# Patient Record
Sex: Female | Born: 1965 | Race: White | Hispanic: No | Marital: Married | Smoking: Never smoker
Health system: Southern US, Community
[De-identification: ages and names within clinical notes are randomized; demographics above are authoritative.]

## PROBLEM LIST (undated history)

## (undated) ENCOUNTER — Emergency Department (HOSPITAL_COMMUNITY): Payer: BC Managed Care – PPO | Source: Home / Self Care

## (undated) DIAGNOSIS — R87610 Atypical squamous cells of undetermined significance on cytologic smear of cervix (ASC-US): Secondary | ICD-10-CM

## (undated) DIAGNOSIS — R87810 Cervical high risk human papillomavirus (HPV) DNA test positive: Secondary | ICD-10-CM

## (undated) DIAGNOSIS — S0990XA Unspecified injury of head, initial encounter: Secondary | ICD-10-CM

## (undated) DIAGNOSIS — S069X9A Unspecified intracranial injury with loss of consciousness of unspecified duration, initial encounter: Secondary | ICD-10-CM

## (undated) HISTORY — DX: Unspecified injury of head, initial encounter: S09.90XA

## (undated) HISTORY — PX: OTHER SURGICAL HISTORY: SHX169

## (undated) HISTORY — DX: Cervical high risk human papillomavirus (HPV) DNA test positive: R87.810

## (undated) HISTORY — DX: Atypical squamous cells of undetermined significance on cytologic smear of cervix (ASC-US): R87.610

## (undated) HISTORY — PX: ABDOMINAL SURGERY: SHX537

## (undated) HISTORY — PX: AUGMENTATION MAMMAPLASTY: SUR837

---

## 1995-06-18 DIAGNOSIS — S0990XA Unspecified injury of head, initial encounter: Secondary | ICD-10-CM

## 1995-06-18 DIAGNOSIS — S069X9A Unspecified intracranial injury with loss of consciousness of unspecified duration, initial encounter: Secondary | ICD-10-CM

## 1995-06-18 DIAGNOSIS — S069XAA Unspecified intracranial injury with loss of consciousness status unknown, initial encounter: Secondary | ICD-10-CM

## 1995-06-18 HISTORY — DX: Unspecified injury of head, initial encounter: S09.90XA

## 1995-06-18 HISTORY — DX: Unspecified intracranial injury with loss of consciousness of unspecified duration, initial encounter: S06.9X9A

## 1995-06-18 HISTORY — DX: Unspecified intracranial injury with loss of consciousness status unknown, initial encounter: S06.9XAA

## 1998-02-22 ENCOUNTER — Other Ambulatory Visit: Admission: RE | Admit: 1998-02-22 | Discharge: 1998-02-22 | Payer: Self-pay | Admitting: Obstetrics and Gynecology

## 1998-06-27 ENCOUNTER — Other Ambulatory Visit: Admission: RE | Admit: 1998-06-27 | Discharge: 1998-06-27 | Payer: Self-pay | Admitting: Gynecology

## 1998-12-12 ENCOUNTER — Inpatient Hospital Stay (HOSPITAL_COMMUNITY): Admission: AD | Admit: 1998-12-12 | Discharge: 1998-12-12 | Payer: Self-pay | Admitting: Gynecology

## 1999-01-23 ENCOUNTER — Inpatient Hospital Stay (HOSPITAL_COMMUNITY): Admission: AD | Admit: 1999-01-23 | Discharge: 1999-01-27 | Payer: Self-pay | Admitting: Obstetrics and Gynecology

## 1999-01-23 ENCOUNTER — Encounter (INDEPENDENT_AMBULATORY_CARE_PROVIDER_SITE_OTHER): Payer: Self-pay | Admitting: Specialist

## 1999-01-30 ENCOUNTER — Encounter (HOSPITAL_COMMUNITY): Admission: RE | Admit: 1999-01-30 | Discharge: 1999-04-30 | Payer: Self-pay | Admitting: Gynecology

## 1999-02-12 ENCOUNTER — Other Ambulatory Visit: Admission: RE | Admit: 1999-02-12 | Discharge: 1999-02-12 | Payer: Self-pay | Admitting: Gynecology

## 2003-01-31 ENCOUNTER — Other Ambulatory Visit: Admission: RE | Admit: 2003-01-31 | Discharge: 2003-01-31 | Payer: Self-pay | Admitting: Gynecology

## 2004-02-08 ENCOUNTER — Other Ambulatory Visit: Admission: RE | Admit: 2004-02-08 | Discharge: 2004-02-08 | Payer: Self-pay | Admitting: Gynecology

## 2005-05-20 ENCOUNTER — Other Ambulatory Visit: Admission: RE | Admit: 2005-05-20 | Discharge: 2005-05-20 | Payer: Self-pay | Admitting: Gynecology

## 2006-05-22 ENCOUNTER — Other Ambulatory Visit: Admission: RE | Admit: 2006-05-22 | Discharge: 2006-05-22 | Payer: Self-pay | Admitting: Gynecology

## 2007-07-28 ENCOUNTER — Other Ambulatory Visit: Admission: RE | Admit: 2007-07-28 | Discharge: 2007-07-28 | Payer: Self-pay | Admitting: Gynecology

## 2007-11-10 ENCOUNTER — Emergency Department (HOSPITAL_COMMUNITY): Admission: EM | Admit: 2007-11-10 | Discharge: 2007-11-10 | Payer: Self-pay | Admitting: Emergency Medicine

## 2008-07-28 ENCOUNTER — Other Ambulatory Visit: Admission: RE | Admit: 2008-07-28 | Discharge: 2008-07-28 | Payer: Self-pay | Admitting: Gynecology

## 2008-07-28 ENCOUNTER — Ambulatory Visit: Payer: Self-pay | Admitting: Gynecology

## 2008-07-28 ENCOUNTER — Encounter: Payer: Self-pay | Admitting: Gynecology

## 2008-12-14 ENCOUNTER — Ambulatory Visit: Payer: Self-pay | Admitting: Gynecology

## 2009-08-10 ENCOUNTER — Ambulatory Visit: Payer: Self-pay | Admitting: Gynecology

## 2009-08-10 ENCOUNTER — Other Ambulatory Visit: Admission: RE | Admit: 2009-08-10 | Discharge: 2009-08-10 | Payer: Self-pay | Admitting: Gynecology

## 2009-12-22 IMAGING — CR DG WRIST COMPLETE 3+V*L*
3 series · 3 of 3 positions shown · non-contrast
Comparison: None.

CLINICAL DATA: Left wrist fracture.

LEFT WRIST - COMPLETE 3+ VIEW

[x wrist pa left]
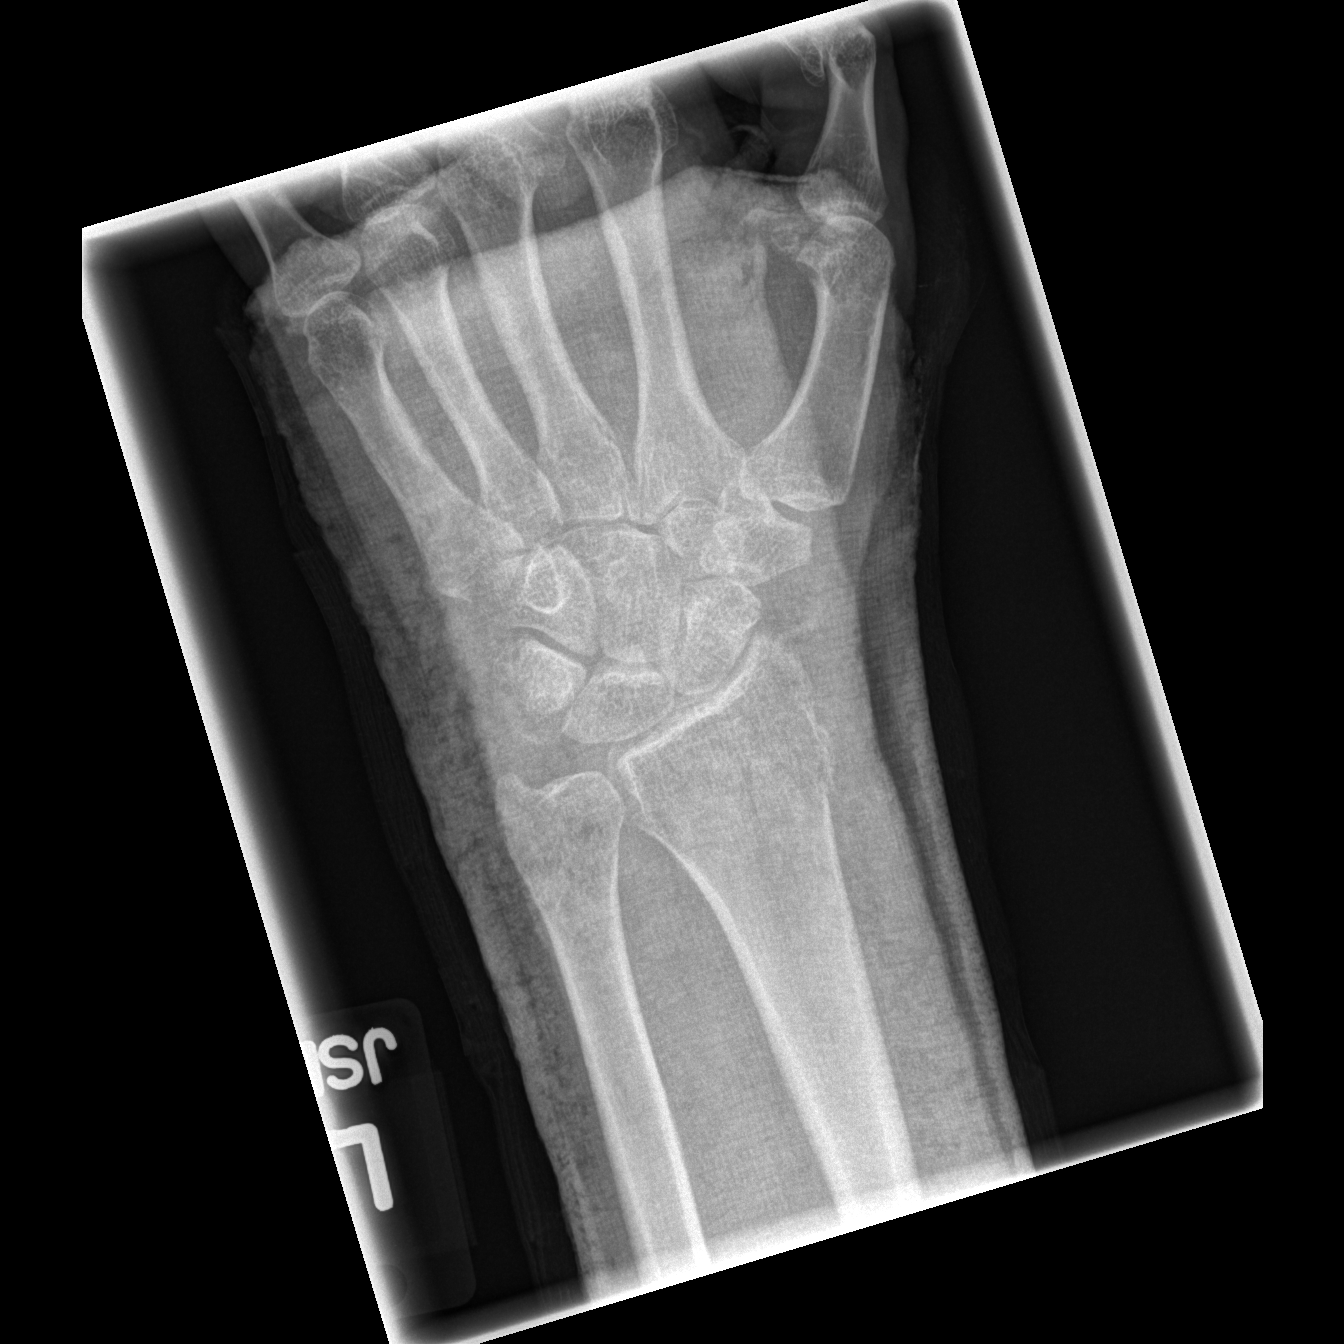

[x wrist obl left]
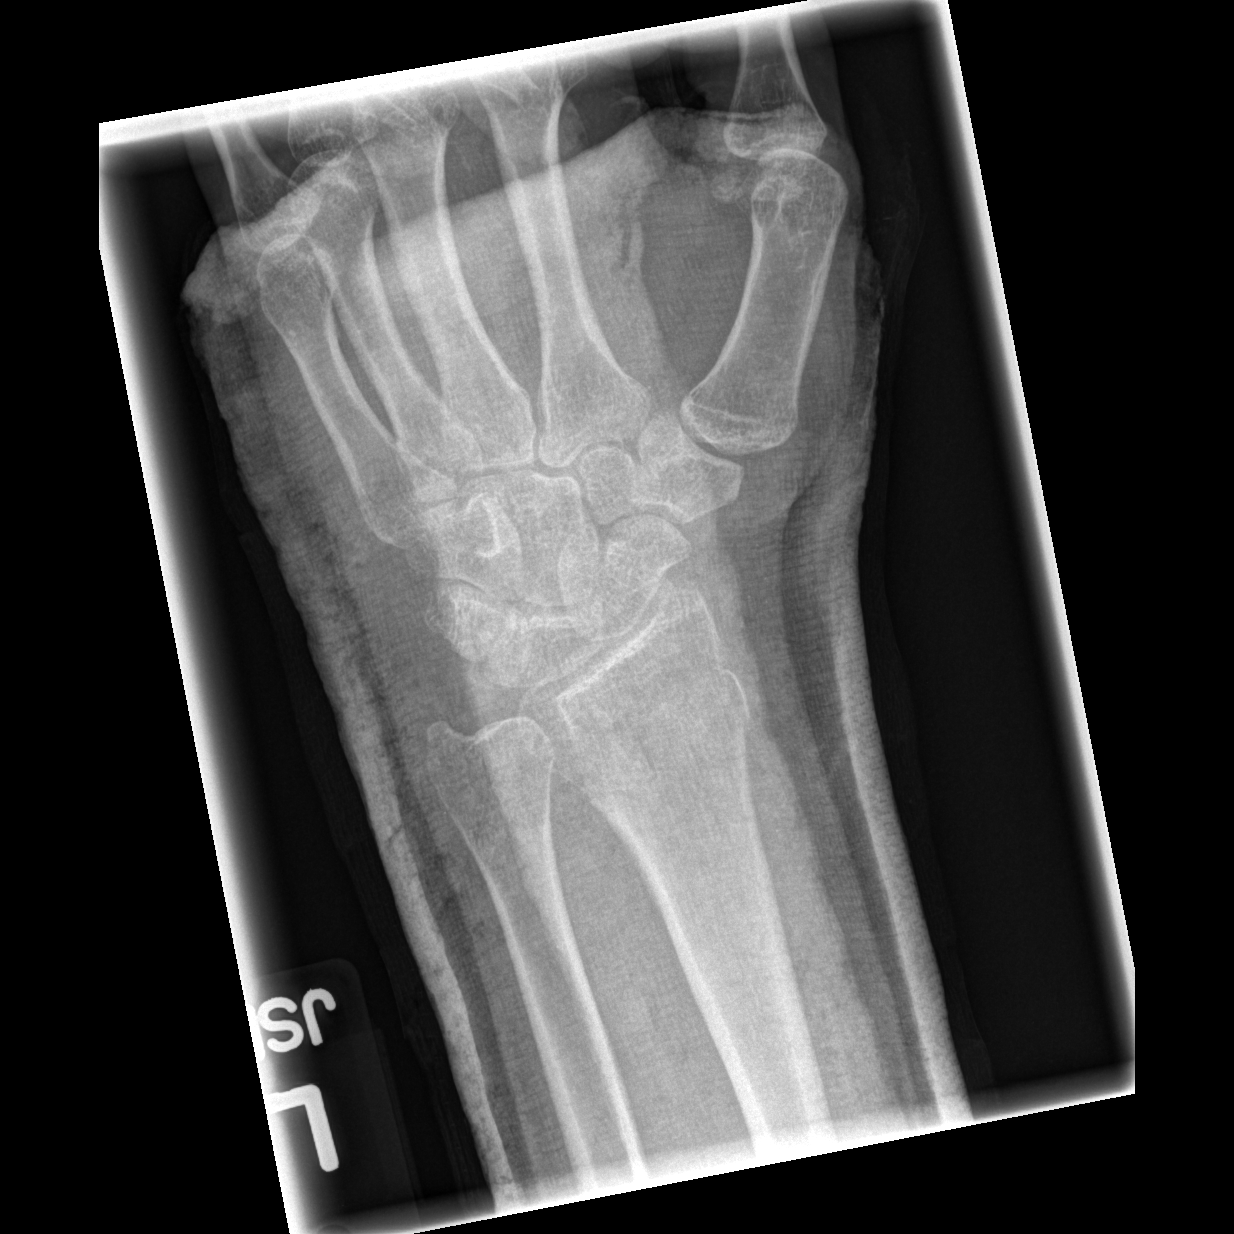

[x wrist lat left]
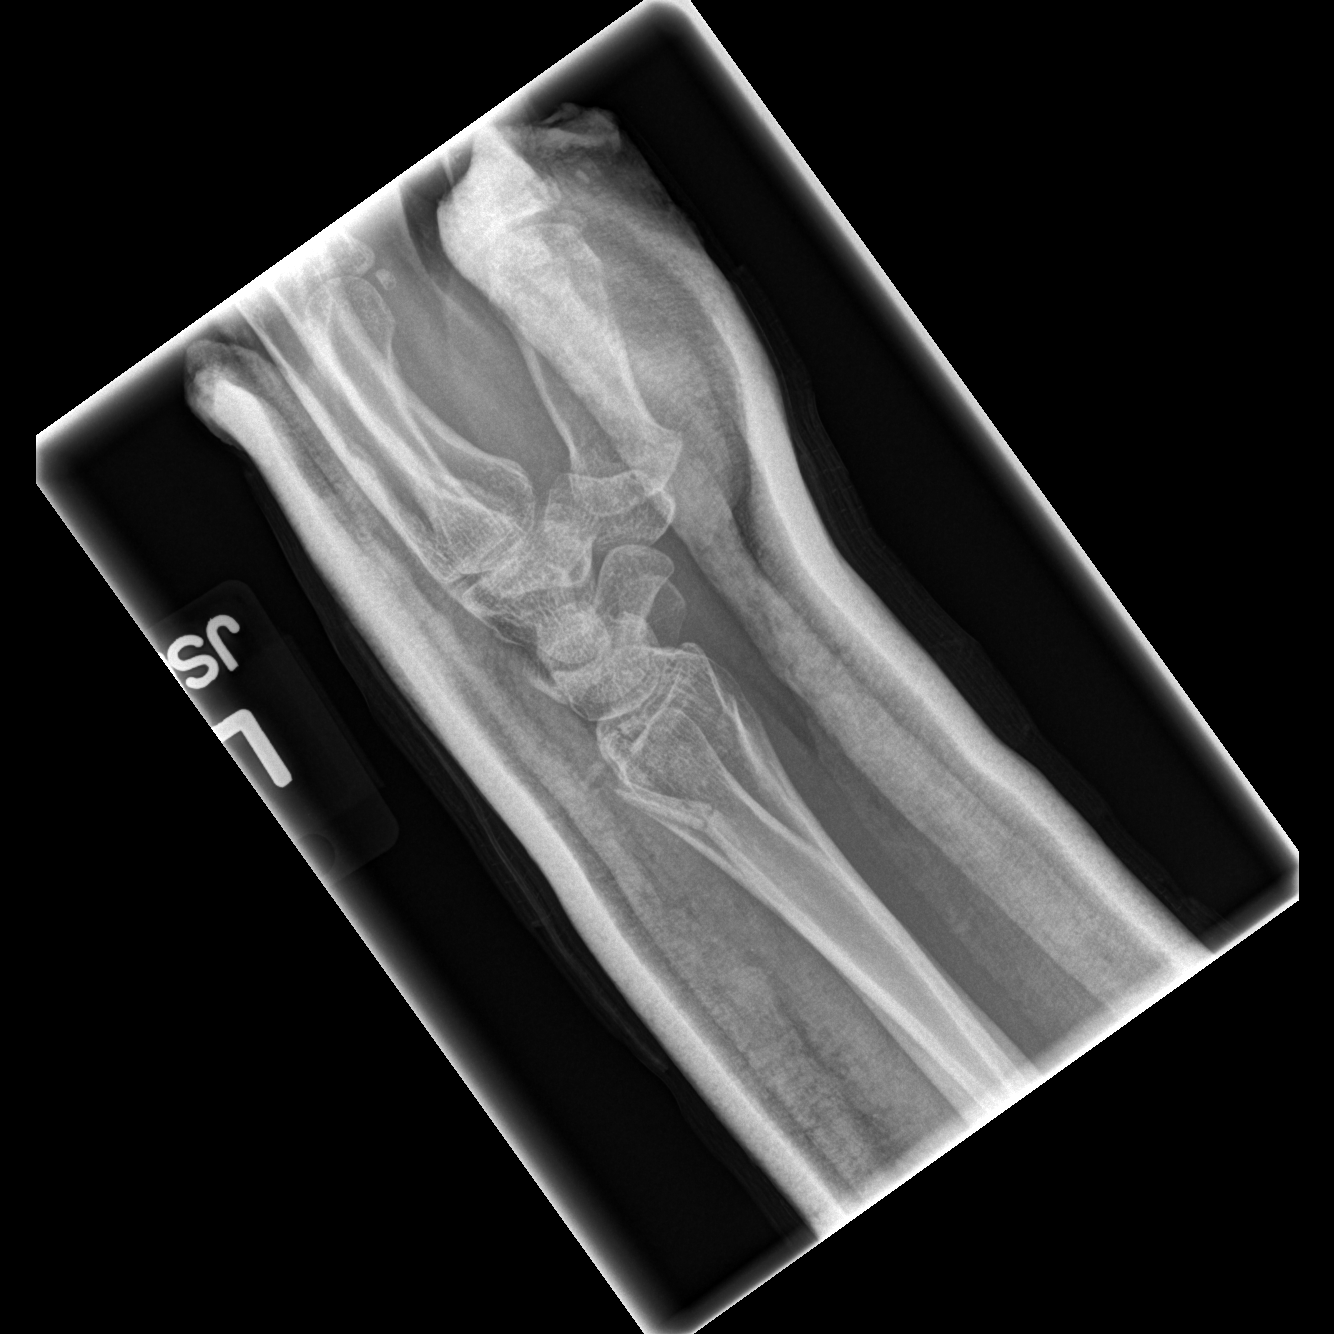

[3 of 3 positions shown; findings below may reference images not displayed]

FINDINGS: Three views of the left wrist were obtained with the
wrist in a plaster splint.  These demonstrate a comminuted fracture
of the distal radial metaphysis, extending into the radiocarpal
joint.  Mild dorsal displacement and angulation of a dorsal
fragment is noted.  No ulna fracture is visualized.
IMPRESSION: Comminuted distal radius fracture, as described above.
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
## 2010-11-14 ENCOUNTER — Other Ambulatory Visit: Payer: Self-pay | Admitting: Gynecology

## 2010-11-14 ENCOUNTER — Other Ambulatory Visit (HOSPITAL_COMMUNITY)
Admission: RE | Admit: 2010-11-14 | Discharge: 2010-11-14 | Disposition: A | Discharge status: Home / Self Care | Payer: BC Managed Care – PPO | Source: Ambulatory Visit | Attending: Gynecology | Admitting: Gynecology

## 2010-11-14 ENCOUNTER — Encounter (INDEPENDENT_AMBULATORY_CARE_PROVIDER_SITE_OTHER): Payer: BC Managed Care – PPO | Admitting: Gynecology

## 2010-11-14 DIAGNOSIS — Z13220 Encounter for screening for lipoid disorders: Secondary | ICD-10-CM

## 2010-11-14 DIAGNOSIS — Z124 Encounter for screening for malignant neoplasm of cervix: Secondary | ICD-10-CM | POA: Insufficient documentation

## 2010-11-14 DIAGNOSIS — Z01419 Encounter for gynecological examination (general) (routine) without abnormal findings: Secondary | ICD-10-CM

## 2010-11-14 DIAGNOSIS — Z833 Family history of diabetes mellitus: Secondary | ICD-10-CM

## 2012-03-11 ENCOUNTER — Encounter: Payer: Self-pay | Admitting: Gynecology

## 2012-03-11 ENCOUNTER — Ambulatory Visit (INDEPENDENT_AMBULATORY_CARE_PROVIDER_SITE_OTHER): Payer: BC Managed Care – PPO | Admitting: Gynecology

## 2012-03-11 VITALS — BP 104/62 | Ht 65.0 in | Wt 124.0 lb

## 2012-03-11 DIAGNOSIS — Z01419 Encounter for gynecological examination (general) (routine) without abnormal findings: Secondary | ICD-10-CM

## 2012-03-11 DIAGNOSIS — N951 Menopausal and female climacteric states: Secondary | ICD-10-CM

## 2012-03-11 LAB — CBC WITH DIFFERENTIAL/PLATELET
Basophils Absolute: 0 10*3/uL (ref 0.0–0.1)
Eosinophils Relative: 1 % (ref 0–5)
HCT: 37.1 % (ref 36.0–46.0)
Lymphocytes Relative: 23 % (ref 12–46)
MCHC: 33.7 g/dL (ref 30.0–36.0)
MCV: 90.5 fL (ref 78.0–100.0)
Monocytes Absolute: 0.4 10*3/uL (ref 0.1–1.0)
RDW: 13.2 % (ref 11.5–15.5)
WBC: 5.7 10*3/uL (ref 4.0–10.5)

## 2012-03-11 NOTE — Progress Notes (Signed)
Martha Caldwell 1966-02-20 [id number]        46 y.o.  G2P2 for annual exam.  Doing well.  Past medical history,surgical history, medications, allergies, family history and social history were all reviewed and documented in the EPIC chart. ROS:  Was performed and pertinent positives and negatives are included in the history.  Exam: Sherrilyn Rist assistant Filed Vitals:   03/11/12 1026  BP: 104/62  Height: 5\' 5"  (1.651 m)  Weight: 124 lb (56.246 kg)   General appearance  Normal Skin grossly normal Head/Neck normal with no cervical or supraclavicular adenopathy thyroid normal Lungs  clear Cardiac RR, without RMG Abdominal  soft, nontender, without masses, organomegaly or hernia. Well-healed abdominoplasty scars. Breasts  examined lying and sitting without masses, retractions, discharge or axillary adenopathy.  Bilateral implants noted. Pelvic  Ext/BUS/vagina  normal   Cervix  normal   Uterus  anteverted, normal size, shape and contour, midline and mobile nontender   Adnexa  Without masses or tenderness    Anus and perineum  normal   Rectovaginal  normal sphincter tone without palpated masses or tenderness.    Assessment/Plan:  46 y.o. G2P2 female for annual exam.   1. Contraception. Patient continues with withdrawal method. We have discussed this multiple times and she understands and accepts the failure risks associated with this. We've discussed options including IUD and she has declined these. 2. Mammography. Patient had her mammogram today. SBE monthly reviewed. Continue with annual mammography. 3. Pap smear. No Pap smear done today. Last Pap smear 2012. Multiple normal reports in her chart with no history of abnormal Pap smears. Review current screening guidelines and plan repeat every 3-5 years. 4. Some occasional hot flushes and mild menstrual irregularity with menses coming a little earlier or later but monthly.  We'll check TSH/FSH. Suspect age related and we'll continue to monitor  if labs normal. 5. Health maintenance. CBC/urinalysis with above labs. Glucose/lipid profile normal last year and we'll not repeat this year. Follow up one year, sooner as needed.    Dara Lords MD, 10:49 AM 03/11/2012

## 2012-03-11 NOTE — Patient Instructions (Signed)
Follow up one year for annual exam 

## 2012-03-12 LAB — URINALYSIS W MICROSCOPIC + REFLEX CULTURE
Bilirubin Urine: NEGATIVE
Casts: NONE SEEN
Crystals: NONE SEEN
Glucose, UA: NEGATIVE mg/dL
Ketones, ur: NEGATIVE mg/dL
Specific Gravity, Urine: 1.012 (ref 1.005–1.030)
Urobilinogen, UA: 1 mg/dL (ref 0.0–1.0)

## 2013-03-17 ENCOUNTER — Encounter: Payer: BC Managed Care – PPO | Admitting: Gynecology

## 2013-03-18 ENCOUNTER — Encounter: Payer: Self-pay | Admitting: Gynecology

## 2013-04-14 ENCOUNTER — Encounter: Payer: Self-pay | Admitting: Gynecology

## 2013-04-14 ENCOUNTER — Ambulatory Visit (INDEPENDENT_AMBULATORY_CARE_PROVIDER_SITE_OTHER): Payer: BC Managed Care – PPO | Admitting: Gynecology

## 2013-04-14 VITALS — BP 120/74 | Ht 65.0 in | Wt 123.0 lb

## 2013-04-14 DIAGNOSIS — N898 Other specified noninflammatory disorders of vagina: Secondary | ICD-10-CM

## 2013-04-14 DIAGNOSIS — N926 Irregular menstruation, unspecified: Secondary | ICD-10-CM

## 2013-04-14 DIAGNOSIS — Z01419 Encounter for gynecological examination (general) (routine) without abnormal findings: Secondary | ICD-10-CM

## 2013-04-14 LAB — CBC WITH DIFFERENTIAL/PLATELET
Basophils Absolute: 0 10*3/uL (ref 0.0–0.1)
Eosinophils Absolute: 0.2 10*3/uL (ref 0.0–0.7)
Eosinophils Relative: 3 % (ref 0–5)
MCH: 29.8 pg (ref 26.0–34.0)
MCHC: 33.8 g/dL (ref 30.0–36.0)
MCV: 88.2 fL (ref 78.0–100.0)
Monocytes Absolute: 0.5 10*3/uL (ref 0.1–1.0)
Platelets: 259 10*3/uL (ref 150–400)
RDW: 13.5 % (ref 11.5–15.5)

## 2013-04-14 LAB — COMPREHENSIVE METABOLIC PANEL
ALT: 10 U/L (ref 0–35)
AST: 13 U/L (ref 0–37)
BUN: 13 mg/dL (ref 6–23)
Creat: 0.66 mg/dL (ref 0.50–1.10)
Total Bilirubin: 0.6 mg/dL (ref 0.3–1.2)

## 2013-04-14 LAB — LIPID PANEL
Cholesterol: 116 mg/dL (ref 0–200)
HDL: 37 mg/dL — ABNORMAL LOW (ref >39)
Total CHOL/HDL Ratio: 3.1 Ratio
VLDL: 10 mg/dL (ref 0–40)

## 2013-04-14 LAB — FOLLICLE STIMULATING HORMONE: FSH: 14.4 m[IU]/mL

## 2013-04-14 LAB — WET PREP FOR TRICH, YEAST, CLUE: Trich, Wet Prep: NONE SEEN

## 2013-04-14 LAB — TSH: TSH: 3.356 u[IU]/mL (ref 0.350–4.500)

## 2013-04-14 MED ORDER — METRONIDAZOLE 500 MG PO TABS: 500.0000 mg | ORAL_TABLET | Freq: Two times a day (BID) | ORAL | Status: DC

## 2013-04-14 NOTE — Progress Notes (Signed)
Martha Caldwell September 10, 1965 [id number]        47 y.o.  G2P2 for annual exam.  Several issues noted below.  Past medical history,surgical history, medications, allergies, family history and social history were all reviewed and documented in the EPIC chart.  ROS:  Performed and pertinent positives and negatives are included in the history, assessment and plan .  Exam: [name] assistant Filed Vitals:   04/14/13 0835  BP: 120/74  Height: 5\' 5"  (1.651 m)  Weight: 123 lb (55.792 kg)   General appearance  Normal Skin grossly normal Head/Neck normal with no cervical or supraclavicular adenopathy thyroid normal Lungs  clear Cardiac RR, without RMG Abdominal  soft, nontender, without masses, organomegaly or hernia Breasts  examined lying and sitting without masses, retractions, discharge or axillary adenopathy. Bilateral implants noted Pelvic  Ext/BUS/vagina  very heavy yellow discharge  Cervix  normal GC/Chlamydia  Uterus  anteverted, normal size, shape and contour, midline and mobile nontender   Adnexa  Without masses or tenderness    Anus and perineum  normal   Rectovaginal  normal sphincter tone without palpated masses or tenderness.    Assessment/Plan:  47 y.o. G2P2 female for annual exam.   1. Heavy vaginal discharge. Clinically suspicious for Trichomonas although wet prep was negative. Treat with Flagyl 500 mg twice a day x7 days. GC chlamydia screen of cervix done. 2. Irregular menses. Patient states that this past year her periods have become more irregular where she will start earlier or later never skips a month. Does have intermenstrual spotting. FSH 19 last year. Recheck FSH/TSH. Baseline sonohysterogram rule out intracavitary abnormalities. Various scenarios and options reviewed pending laboratory/ultrasound results. 3. Contraception. Patient continues to use withdrawal method. We have discussed multiple times the failure risk and she understands this and declines alternative  contraception. 4. Mammography 03/2013. Continued annual mammography. SBE monthly reviewed. 5. Pap smear 2012. No Pap smear done today No history of abnormal Pap smears. Plan repeat Pap smear next year at 3 year interval. 6. Health maintenance. Baseline CBC comprehensive metabolic panel lipid profile urinalysis TSH FSH ordered. Followup for sonohysterogram.  Note: This document was prepared with digital dictation and possible smart phrase technology. Any transcriptional errors that result from this process are unintentional.   Dara Lords MD, 9:04 AM 04/14/2013

## 2013-04-14 NOTE — Patient Instructions (Signed)
Followup for ultrasound as scheduled. Take Flagyl medication twice daily for 7 days. Avoid alcohol while taking.

## 2013-04-15 LAB — URINALYSIS W MICROSCOPIC + REFLEX CULTURE
Casts: NONE SEEN
Glucose, UA: NEGATIVE mg/dL
Hgb urine dipstick: NEGATIVE
Ketones, ur: NEGATIVE mg/dL
Protein, ur: NEGATIVE mg/dL
pH: 7.5 (ref 5.0–8.0)

## 2013-04-16 LAB — GC/CHLAMYDIA PROBE AMP: CT Probe RNA: UNDETERMINED

## 2013-04-17 LAB — URINE CULTURE: Colony Count: >=100000

## 2013-04-19 ENCOUNTER — Other Ambulatory Visit: Payer: Self-pay | Admitting: Gynecology

## 2013-04-19 MED ORDER — AMPICILLIN 500 MG PO CAPS: 500.0000 mg | ORAL_CAPSULE | Freq: Four times a day (QID) | ORAL | Status: DC

## 2013-04-30 ENCOUNTER — Other Ambulatory Visit: Payer: Self-pay | Admitting: Gynecology

## 2013-04-30 ENCOUNTER — Ambulatory Visit (INDEPENDENT_AMBULATORY_CARE_PROVIDER_SITE_OTHER): Payer: BC Managed Care – PPO

## 2013-04-30 ENCOUNTER — Encounter: Payer: Self-pay | Admitting: Gynecology

## 2013-04-30 ENCOUNTER — Ambulatory Visit (INDEPENDENT_AMBULATORY_CARE_PROVIDER_SITE_OTHER): Payer: BC Managed Care – PPO | Admitting: Gynecology

## 2013-04-30 DIAGNOSIS — N938 Other specified abnormal uterine and vaginal bleeding: Secondary | ICD-10-CM

## 2013-04-30 DIAGNOSIS — Z113 Encounter for screening for infections with a predominantly sexual mode of transmission: Secondary | ICD-10-CM

## 2013-04-30 DIAGNOSIS — N949 Unspecified condition associated with female genital organs and menstrual cycle: Secondary | ICD-10-CM

## 2013-04-30 DIAGNOSIS — N926 Irregular menstruation, unspecified: Secondary | ICD-10-CM

## 2013-04-30 DIAGNOSIS — IMO0002 Reserved for concepts with insufficient information to code with codable children: Secondary | ICD-10-CM

## 2013-04-30 DIAGNOSIS — N83209 Unspecified ovarian cyst, unspecified side: Secondary | ICD-10-CM

## 2013-04-30 NOTE — Progress Notes (Signed)
Patient presents for sonohysterogram due to history of irregular bleeding. Menses are longer very as far as when they occur during the month and  was having some intermenstrual spotting.  Ultrasound shows uterus normal size and echotexture. Endometrial echo 7.1 mm. Left ovary is normal. Right ovary with 52 x 27 x 50 mm echo-free thin-walled cyst. Septum versus 2 separate cysts noted. Slight vascular activity. Cul-de-sac negative.  Sonohysterogram was performed, sterile technique, easy catheter introduction, good distention with no abnormalities. Endometrial sample taken. Patient tolerated well.  Assessment and plan: 1. Irregular menses. Sonohysterogram is negative. Patient will followup for biopsy results. Her FSH and TSH were normal. Patient leaning towards Mirena IUD to help with the bleeding as well as contraception. Will followup if she ultimately decides for this. Otherwise will keep menstrual calendar over the next 2 months. 2. Ovarian cyst. Reviewed possibilities with the patient to include functional, benign tumor, malignancy. Patient is otherwise asymptomatic. Question whether this is shooting to her irregular bleeding also. Recommend repeat ultrasound in 2 months. Scenarios reviewed to include possible laparoscopy with removal. Patient's comfortable with the two-month observation and will schedule an appointment for this. 3. Followup GC Chlamydia culture. Patient had a culture done previously and workup of her heavy vaginal discharge. The culture returned indeterminate for both. I repeated this today.

## 2013-04-30 NOTE — Patient Instructions (Signed)
Followup in 2 months for repeat ultrasound to followup on the ovarian cyst. Followup sooner if you want to go ahead with the Mirena IUD. Office will call you with biopsy results within the next several days.

## 2013-05-01 LAB — GC/CHLAMYDIA PROBE AMP
CT Probe RNA: NEGATIVE
GC Probe RNA: NEGATIVE

## 2013-05-07 ENCOUNTER — Telehealth: Payer: Self-pay | Admitting: Gynecology

## 2013-05-07 NOTE — Telephone Encounter (Signed)
05/07/13-Pt insurance covers the Mirena IUD & insertion at 100%. Unable to LM for pt as VM not set up. She has appt with TF on 05/11/13/[facility]

## 2013-05-11 ENCOUNTER — Encounter: Payer: Self-pay | Admitting: Gynecology

## 2013-05-11 ENCOUNTER — Ambulatory Visit (INDEPENDENT_AMBULATORY_CARE_PROVIDER_SITE_OTHER): Payer: BC Managed Care – PPO | Admitting: Gynecology

## 2013-05-11 DIAGNOSIS — Z30431 Encounter for routine checking of intrauterine contraceptive device: Secondary | ICD-10-CM

## 2013-05-11 MED ORDER — LEVONORGESTREL 20 MCG/24HR IU IUD: INTRAUTERINE_SYSTEM | Freq: Once | INTRAUTERINE | Status: DC

## 2013-05-11 NOTE — Progress Notes (Signed)
Patient presents to have IUD placed but unfortunately has not started her period yet. She was asked to return when she is on a regular menses.

## 2013-05-11 NOTE — Patient Instructions (Addendum)
Follow for ID placement once you're on a regular menses.

## 2013-07-02 ENCOUNTER — Other Ambulatory Visit: Payer: BC Managed Care – PPO

## 2013-07-02 ENCOUNTER — Ambulatory Visit: Payer: BC Managed Care – PPO | Admitting: Gynecology

## 2013-08-06 ENCOUNTER — Encounter: Payer: Self-pay | Admitting: Gynecology

## 2013-08-06 ENCOUNTER — Ambulatory Visit (INDEPENDENT_AMBULATORY_CARE_PROVIDER_SITE_OTHER): Payer: BC Managed Care – PPO | Admitting: Gynecology

## 2013-08-06 DIAGNOSIS — N83209 Unspecified ovarian cyst, unspecified side: Secondary | ICD-10-CM

## 2013-08-06 DIAGNOSIS — Z30430 Encounter for insertion of intrauterine contraceptive device: Secondary | ICD-10-CM

## 2013-08-06 HISTORY — PX: INTRAUTERINE DEVICE INSERTION: SHX323

## 2013-08-06 NOTE — Progress Notes (Signed)
Patient presents for Mirena IUD placement. She has read through the booklet, has no contraindications and signed the consent form. She currently is on a normal menses.  I reviewed the insertional process with her as well as the risks to include infection, either immediate or long-term, uterine perforation or migration requiring surgery to remove, other complications such as pain, hormonal side effects and possibility of failure with subsequent pregnancy.   Exam with Sherrilyn RistKari assistant Pelvic: External BUS vagina normal. Cervix normal with menses flow. Uterus anteverted normal size shape contour midline mobile nontender. Adnexa without masses or tenderness.  Procedure: The cervix was cleansed with Betadine, anterior lip grasped with a single-tooth tenaculum, the uterus was sounded and a Mirena IUD was placed according to manufacturer's recommendations without difficulty. The strings were trimmed. The patient tolerated well and will follow up in one month for a postinsertional check.  Patient also is to have a repeat ultrasound to followup on an ovarian cyst noted at her 04/2013 ultrasound. She will schedule this appointment along with her postinsertional check appointment.  Lot number:  [id number]    Note: This document was prepared with digital dictation and possible smart phrase technology. Any transcriptional errors that result from this process are unintentional.  Dara LordsFONTAINE,TIMOTHY P MD, 10:08 AM 08/06/2013

## 2013-08-06 NOTE — Patient Instructions (Signed)
Intrauterine Device Insertion   Most often, an intrauterine device (IUD) is inserted into the uterus to prevent pregnancy. There are 2 types of IUDs available:  · Copper IUD This type of IUD creates an environment that is not favorable to sperm survival. The mechanism of action of the copper IUD is not known for certain. It can stay in place for 10 years.  · Hormone IUD This type of IUD contains the hormone progestin (synthetic progesterone). The progestin thickens the cervical mucus and prevents sperm from entering the uterus, and it also thins the uterine lining. There is no evidence that the hormone IUD prevents implantation. One hormone IUD can stay in place for up to 5 years, and a different hormone IUD can stay in place for up to 3 years.  An IUD is the most cost-effective birth control if left in place for the full duration. It may be removed at any time.  LET YOUR HEALTH CARE PROVIDER KNOW ABOUT:  · Any allergies you have.  · All medicines you are taking, including vitamins, herbs, eye drops, creams, and over-the-counter medicines.  · Previous problems you or members of your family have had with the use of anesthetics.  · Any blood disorders you have.  · Previous surgeries you have had.  · Possibility of pregnancy.  · Medical conditions you have.  RISKS AND COMPLICATIONS   Generally, intrauterine device insertion is a safe procedure. However, as with any procedure, complications can occur. Possible complications include:  · Accidental puncture (perforation) of the uterus.  · Accidental placement of the IUD either in the muscle layer of the uterus (myometrium) or outside the uterus. If this happens, the IUD can be found essentially floating around the bowels and must be taken out surgically.  · The IUD may fall out of the uterus (expulsion). This is more common in women who have recently had a child.    · Pregnancy in the fallopian tube (ectopic).  · Pelvic inflammatory disease (PID), which is infection of  the uterus and fallopian tubes. The risk of PID is slightly increased in the first 20 days after the IUD is placed, but the overall risk is still very low.  BEFORE THE PROCEDURE  · Schedule the IUD insertion for when you will have your menstrual period or right after, to make sure you are not pregnant. Placement of the IUD is better tolerated shortly after a menstrual cycle.  · You may need to take tests or be examined to make sure you are not pregnant.  · You may be required to take a pregnancy test.  · You may be required to get checked for sexually transmitted infections (STIs) prior to placement. Placing an IUD in someone who has an infection can make the infection worse.  · You may be given a pain reliever to take 1 or 2 hours before the procedure.  · An exam will be performed to determine the size and position of your uterus.  · Ask your health care provider about changing or stopping your regular medicines.  PROCEDURE   · A tool (speculum) is placed in the vagina. This allows your health care provider to see the lower part of the uterus (cervix).  · The cervix is prepped with a medicine that lowers the risk of infection.  · You may be given a medicine to numb each side of the cervix (intracervical or paracervical block). This is used to block and control any discomfort with insertion.  · A tool (  uterine sound) is inserted into the uterus to determine the length of the uterine cavity and the direction the uterus may be tilted.  · A slim instrument (IUD inserter) is inserted through the cervical canal and into your uterus.  · The IUD is placed in the uterine cavity and the insertion device is removed.  · The nylon string that is attached to the IUD and used for eventual IUD removal is trimmed. It is trimmed so that it lays high in the vagina, just outside the cervix.  AFTER THE PROCEDURE  · You may have bleeding after the procedure. This is normal. It varies from light spotting for a few days to menstrual-like  bleeding.   · You may have mild cramping.  Document Released: 01/30/2011 Document Revised: 03/24/2013 Document Reviewed: 11/22/2012  Exit[employer]® Patient Information ©2014 [employer].

## 2013-09-03 ENCOUNTER — Other Ambulatory Visit: Payer: Self-pay | Admitting: Gynecology

## 2013-09-03 ENCOUNTER — Encounter: Payer: Self-pay | Admitting: Gynecology

## 2013-09-03 ENCOUNTER — Ambulatory Visit (INDEPENDENT_AMBULATORY_CARE_PROVIDER_SITE_OTHER): Payer: BC Managed Care – PPO | Admitting: Gynecology

## 2013-09-03 ENCOUNTER — Ambulatory Visit (INDEPENDENT_AMBULATORY_CARE_PROVIDER_SITE_OTHER): Payer: BC Managed Care – PPO

## 2013-09-03 DIAGNOSIS — N83209 Unspecified ovarian cyst, unspecified side: Secondary | ICD-10-CM

## 2013-09-03 DIAGNOSIS — Z30431 Encounter for routine checking of intrauterine contraceptive device: Secondary | ICD-10-CM

## 2013-09-03 NOTE — Patient Instructions (Signed)
Followup in November 2015 for annual exam. Followup sooner if any issues.

## 2013-09-03 NOTE — Progress Notes (Signed)
Martha BrassMelissa D Caldwell 25-Feb-1966 [id number]        48 y.o.  G2P2 presents for followup ultrasound with history of 41 mm mean right ovarian cyst on November 2014 ultrasound. Also for followup IUD insertion will check. Had some bleeding for several weeks after insertion but has done well without bleeding now. Not having any pain or other issues.  Past medical history,surgical history, problem list, medications, allergies, family history and social history were all reviewed and documented in the EPIC chart.  Exam: [name] assistant General appearance  Normal Abdomen soft nontender without masses guarding rebound organomegaly. Pelvic external BUS vagina normal. Cervix normal with IUD string visualized and normal length. Uterus grossly normal sized mobile nontender. Adnexa without masses or tenderness.  Ultrasound shows uterus grossly normal generous in size. Endometrial echo 9.3 mm. IUD visualized within the endometrial cavity. Right ovary normal. Left ovary with echo-free thin-walled avascular cyst 33 mm mean. Cul-de-sac negative.  Assessment/Plan:  48 y.o. G2P2  1. History of right ovarian cyst now resolved. Now with left ovarian cyst. Small avascular. Suspect physiologic. Options of followup to include repeat ultrasound versus expectant management discussed. I think given the situation expectant management is reasonable and she agrees. She'll follow up if she has any pain or other symptoms. 2. IUD check. Doing well. IUD string appropriate length. Ultrasound demonstrates within the cavity. Followup in November 2015 and she is due for her annual exam. Sooner if any issues.   Note: This document was prepared with digital dictation and possible smart phrase technology. Any transcriptional errors that result from this process are unintentional.   Dara LordsFONTAINE,Finn Amos P MD, 10:32 AM 09/03/2013

## 2014-04-17 DIAGNOSIS — R87610 Atypical squamous cells of undetermined significance on cytologic smear of cervix (ASC-US): Secondary | ICD-10-CM

## 2014-04-17 HISTORY — DX: Atypical squamous cells of undetermined significance on cytologic smear of cervix (ASC-US): R87.610

## 2014-04-18 ENCOUNTER — Ambulatory Visit (INDEPENDENT_AMBULATORY_CARE_PROVIDER_SITE_OTHER): Payer: BC Managed Care – PPO | Admitting: Gynecology

## 2014-04-18 ENCOUNTER — Other Ambulatory Visit (HOSPITAL_COMMUNITY)
Admission: RE | Admit: 2014-04-18 | Discharge: 2014-04-18 | Disposition: A | Discharge status: Home / Self Care | Payer: BC Managed Care – PPO | Source: Ambulatory Visit | Attending: Gynecology | Admitting: Gynecology

## 2014-04-18 ENCOUNTER — Encounter: Payer: Self-pay | Admitting: Gynecology

## 2014-04-18 VITALS — BP 120/70 | Ht 66.0 in | Wt 129.0 lb

## 2014-04-18 DIAGNOSIS — Z1151 Encounter for screening for human papillomavirus (HPV): Secondary | ICD-10-CM | POA: Insufficient documentation

## 2014-04-18 DIAGNOSIS — Z30431 Encounter for routine checking of intrauterine contraceptive device: Secondary | ICD-10-CM

## 2014-04-18 DIAGNOSIS — Z01411 Encounter for gynecological examination (general) (routine) with abnormal findings: Secondary | ICD-10-CM | POA: Insufficient documentation

## 2014-04-18 DIAGNOSIS — R87810 Cervical high risk human papillomavirus (HPV) DNA test positive: Secondary | ICD-10-CM | POA: Diagnosis present

## 2014-04-18 DIAGNOSIS — Z01419 Encounter for gynecological examination (general) (routine) without abnormal findings: Secondary | ICD-10-CM

## 2014-04-18 LAB — COMPREHENSIVE METABOLIC PANEL
ALBUMIN: 4.1 g/dL (ref 3.5–5.2)
ALK PHOS: 27 U/L — AB (ref 39–117)
ALT: 11 U/L (ref 0–35)
AST: 17 U/L (ref 0–37)
BILIRUBIN TOTAL: 0.6 mg/dL (ref 0.2–1.2)
BUN: 14 mg/dL (ref 6–23)
CO2: 25 mEq/L (ref 19–32)
Calcium: 8.7 mg/dL (ref 8.4–10.5)
Chloride: 105 mEq/L (ref 96–112)
Creat: 0.69 mg/dL (ref 0.50–1.10)
GLUCOSE: 78 mg/dL (ref 70–99)
POTASSIUM: 3.7 meq/L (ref 3.5–5.3)
Sodium: 141 mEq/L (ref 135–145)
Total Protein: 6 g/dL (ref 6.0–8.3)

## 2014-04-18 LAB — LIPID PANEL
Cholesterol: 114 mg/dL (ref 0–200)
HDL: 40 mg/dL (ref >39)
LDL CALC: 65 mg/dL (ref 0–99)
TRIGLYCERIDES: 44 mg/dL (ref <150)
Total CHOL/HDL Ratio: 2.9 Ratio
VLDL: 9 mg/dL (ref 0–40)

## 2014-04-18 LAB — CBC WITH DIFFERENTIAL/PLATELET
BASOS PCT: 0 % (ref 0–1)
Basophils Absolute: 0 10*3/uL (ref 0.0–0.1)
EOS ABS: 0.1 10*3/uL (ref 0.0–0.7)
Eosinophils Relative: 2 % (ref 0–5)
HEMATOCRIT: 36 % (ref 36.0–46.0)
HEMOGLOBIN: 12.1 g/dL (ref 12.0–15.0)
Lymphocytes Relative: 36 % (ref 12–46)
Lymphs Abs: 1.8 10*3/uL (ref 0.7–4.0)
MCH: 30.9 pg (ref 26.0–34.0)
MCHC: 33.6 g/dL (ref 30.0–36.0)
MCV: 91.8 fL (ref 78.0–100.0)
MONO ABS: 0.4 10*3/uL (ref 0.1–1.0)
MONOS PCT: 7 % (ref 3–12)
Neutro Abs: 2.8 10*3/uL (ref 1.7–7.7)
Neutrophils Relative %: 55 % (ref 43–77)
Platelets: 211 10*3/uL (ref 150–400)
RBC: 3.92 MIL/uL (ref 3.87–5.11)
RDW: 14.3 % (ref 11.5–15.5)
WBC: 5 10*3/uL (ref 4.0–10.5)

## 2014-04-18 NOTE — Addendum Note (Signed)
Addended by: Dayna BarkerGARDNER, KIMBERLY K on: 04/18/2014 09:52 AM   Modules accepted: Orders, SmartSet

## 2014-04-18 NOTE — Patient Instructions (Signed)
You may obtain a copy of any labs that were done today by logging onto MyChart as outlined in the instructions provided with your AVS (after visit summary). The office will not call with normal lab results but certainly if there are any significant abnormalities then we will contact you.   Health Maintenance, Female A healthy lifestyle and preventative care can promote health and wellness.  Maintain regular health, dental, and eye exams.  Eat a healthy diet. Foods like vegetables, fruits, whole grains, low-fat dairy products, and lean protein foods contain the nutrients you need without too many calories. Decrease your intake of foods high in solid fats, added sugars, and salt. Get information about a proper diet from your caregiver, if necessary.  Regular physical exercise is one of the most important things you can do for your health. Most adults should get at least 150 minutes of moderate-intensity exercise (any activity that increases your heart rate and causes you to sweat) each week. In addition, most adults need muscle-strengthening exercises on 2 or more days a week.   Maintain a healthy weight. The body mass index (BMI) is a screening tool to identify possible weight problems. It provides an estimate of body fat based on height and weight. Your caregiver can help determine your BMI, and can help you achieve or maintain a healthy weight. For adults 20 years and older:  A BMI below 18.5 is considered underweight.  A BMI of 18.5 to 24.9 is normal.  A BMI of 25 to 29.9 is considered overweight.  A BMI of 30 and above is considered obese.  Maintain normal blood lipids and cholesterol by exercising and minimizing your intake of saturated fat. Eat a balanced diet with plenty of fruits and vegetables. Blood tests for lipids and cholesterol should begin at age 61 and be repeated every 5 years. If your lipid or cholesterol levels are high, you are over 50, or you are a high risk for heart  disease, you may need your cholesterol levels checked more frequently.Ongoing high lipid and cholesterol levels should be treated with medicines if diet and exercise are not effective.  If you smoke, find out from your caregiver how to quit. If you do not use tobacco, do not start.  Lung cancer screening is recommended for adults aged 33 80 years who are at high risk for developing lung cancer because of a history of smoking. Yearly low-dose computed tomography (CT) is recommended for people who have at least a 30-pack-year history of smoking and are a current smoker or have quit within the past 15 years. A pack year of smoking is smoking an average of 1 pack of cigarettes a day for 1 year (for example: 1 pack a day for 30 years or 2 packs a day for 15 years). Yearly screening should continue until the smoker has stopped smoking for at least 15 years. Yearly screening should also be stopped for people who develop a health problem that would prevent them from having lung cancer treatment.  If you are pregnant, do not drink alcohol. If you are breastfeeding, be very cautious about drinking alcohol. If you are not pregnant and choose to drink alcohol, do not exceed 1 drink per day. One drink is considered to be 12 ounces (355 mL) of beer, 5 ounces (148 mL) of wine, or 1.5 ounces (44 mL) of liquor.  Avoid use of street drugs. Do not share needles with anyone. Ask for help if you need support or instructions about stopping  the use of drugs.  High blood pressure causes heart disease and increases the risk of stroke. Blood pressure should be checked at least every 1 to 2 years. Ongoing high blood pressure should be treated with medicines, if weight loss and exercise are not effective.  If you are 59 to 47 years old, ask your caregiver if you should take aspirin to prevent strokes.  Diabetes screening involves taking a blood sample to check your fasting blood sugar level. This should be done once every 3  years, after age 91, if you are within normal weight and without risk factors for diabetes. Testing should be considered at a younger age or be carried out more frequently if you are overweight and have at least 1 risk factor for diabetes.  Breast cancer screening is essential preventative care for women. You should practice "breast self-awareness." This means understanding the normal appearance and feel of your breasts and may include breast self-examination. Any changes detected, no matter how small, should be reported to a caregiver. Women in their 66s and 30s should have a clinical breast exam (CBE) by a caregiver as part of a regular health exam every 1 to 3 years. After age 101, women should have a CBE every year. Starting at age 100, women should consider having a mammogram (breast X-ray) every year. Women who have a family history of breast cancer should talk to their caregiver about genetic screening. Women at a high risk of breast cancer should talk to their caregiver about having an MRI and a mammogram every year.  Breast cancer gene (BRCA)-related cancer risk assessment is recommended for women who have family members with BRCA-related cancers. BRCA-related cancers include breast, ovarian, tubal, and peritoneal cancers. Having family members with these cancers may be associated with an increased risk for harmful changes (mutations) in the breast cancer genes BRCA1 and BRCA2. Results of the assessment will determine the need for genetic counseling and BRCA1 and BRCA2 testing.  The Pap test is a screening test for cervical cancer. Women should have a Pap test starting at age 57. Between ages 25 and 35, Pap tests should be repeated every 2 years. Beginning at age 37, you should have a Pap test every 3 years as long as the past 3 Pap tests have been normal. If you had a hysterectomy for a problem that was not cancer or a condition that could lead to cancer, then you no longer need Pap tests. If you are  between ages 50 and 76, and you have had normal Pap tests going back 10 years, you no longer need Pap tests. If you have had past treatment for cervical cancer or a condition that could lead to cancer, you need Pap tests and screening for cancer for at least 20 years after your treatment. If Pap tests have been discontinued, risk factors (such as a new sexual partner) need to be reassessed to determine if screening should be resumed. Some women have medical problems that increase the chance of getting cervical cancer. In these cases, your caregiver may recommend more frequent screening and Pap tests.  The human papillomavirus (HPV) test is an additional test that may be used for cervical cancer screening. The HPV test looks for the virus that can cause the cell changes on the cervix. The cells collected during the Pap test can be tested for HPV. The HPV test could be used to screen women aged 44 years and older, and should be used in women of any age  who have unclear Pap test results. After the age of 55, women should have HPV testing at the same frequency as a Pap test.  Colorectal cancer can be detected and often prevented. Most routine colorectal cancer screening begins at the age of 44 and continues through age 20. However, your caregiver may recommend screening at an earlier age if you have risk factors for colon cancer. On a yearly basis, your caregiver may provide home test kits to check for hidden blood in the stool. Use of a small camera at the end of a tube, to directly examine the colon (sigmoidoscopy or colonoscopy), can detect the earliest forms of colorectal cancer. Talk to your caregiver about this at age 86, when routine screening begins. Direct examination of the colon should be repeated every 5 to 10 years through age 13, unless early forms of pre-cancerous polyps or small growths are found.  Hepatitis C blood testing is recommended for all people born from [birth year] through 1964 and any  individual with known risks for hepatitis C.  Practice safe sex. Use condoms and avoid high-risk sexual practices to reduce the spread of sexually transmitted infections (STIs). Sexually active women aged 36 and younger should be checked for Chlamydia, which is a common sexually transmitted infection. Older women with new or multiple partners should also be tested for Chlamydia. Testing for other STIs is recommended if you are sexually active and at increased risk.  Osteoporosis is a disease in which the bones lose minerals and strength with aging. This can result in serious bone fractures. The risk of osteoporosis can be identified using a bone density scan. Women ages 20 and over and women at risk for fractures or osteoporosis should discuss screening with their caregivers. Ask your caregiver whether you should be taking a calcium supplement or vitamin D to reduce the rate of osteoporosis.  Menopause can be associated with physical symptoms and risks. Hormone replacement therapy is available to decrease symptoms and risks. You should talk to your caregiver about whether hormone replacement therapy is right for you.  Use sunscreen. Apply sunscreen liberally and repeatedly throughout the day. You should seek shade when your shadow is shorter than you. Protect yourself by wearing long sleeves, pants, a wide-brimmed hat, and sunglasses year round, whenever you are outdoors.  Notify your caregiver of new moles or changes in moles, especially if there is a change in shape or color. Also notify your caregiver if a mole is larger than the size of a pencil eraser.  Stay current with your immunizations. Document Released: 12/17/2010 Document Revised: 09/28/2012 Document Reviewed: 12/17/2010 [facility] Patient Information 2014 [employer].

## 2014-04-18 NOTE — Progress Notes (Signed)
Martha Caldwell [birth year] [id number]        48 y.o.  G2P2 for annual exam.  Doing well without complaints.  Past medical history,surgical history, problem list, medications, allergies, family history and social history were all reviewed and documented as reviewed in the EPIC chart.  ROS:  12 system ROS performed with pertinent positives and negatives included in the history, assessment and plan.   Additional significant findings :  none   Exam: [name] personassistant Filed Vitals:   04/18/14 0926  BP: 120/70  Height: 5\' 6"  (1.676 m)  Weight: 129 lb (58.514 kg)   General appearance:  Normal affect, orientation and appearance. Skin: Grossly normal HEENT: Without gross lesions.  No cervical or supraclavicular adenopathy. Thyroid normal.  Lungs:  Clear without wheezing, rales or rhonchi Cardiac: RR, without RMG Abdominal:  Soft, nontender, without masses, guarding, rebound, organomegaly or hernia Breasts:  Examined lying and sitting without masses, retractions, discharge or axillary adenopathy.  Bilateral implants noted. Pelvic:  Ext/BUS/vagina normal.  Cervix normal. IUD strings visualized. Pap/HPV done.  Uterus anteverted, normal size, shape and contour, midline and mobile nontender   Adnexa  Without masses or tenderness    Anus and perineum  Normal   Rectovaginal  Normal sphincter tone without palpated masses or tenderness.    Assessment/Plan:  48 y.o. G2P2 female for annual exam with regular light menses, Mirena IUD.   1. Mirena IUD 07/2013. Doing well with regular light menses. IUD strings visualized. Continue to monitor. 2. Pap smear 2012.  Pap/HPV today.  No history of abnormal Pap smears previously.  Repeat at 3-5 year interval assuming this Pap smear is normal per current screening guidelines. 3. Mammogram scheduled today. Continue with annual mammography. SBE monthly reviewed. 4. Health maintenance. Baseline CBC comprehensive metabolic panel lipid profile urinalysis ordered.  Follow up in one year, sooner as needed.     Dara LordsFONTAINE,Tyler Robidoux P MD, 9:47 AM 04/18/2014

## 2014-04-19 LAB — CYTOLOGY - PAP

## 2014-04-19 LAB — URINALYSIS W MICROSCOPIC + REFLEX CULTURE
BILIRUBIN URINE: NEGATIVE
CASTS: NONE SEEN
Crystals: NONE SEEN
Glucose, UA: NEGATIVE mg/dL
KETONES UR: NEGATIVE mg/dL
NITRITE: NEGATIVE
PH: 7 (ref 5.0–8.0)
Protein, ur: 100 mg/dL — AB
Specific Gravity, Urine: 1.023 (ref 1.005–1.030)
Urobilinogen, UA: 0.2 mg/dL (ref 0.0–1.0)

## 2014-04-20 LAB — URINE CULTURE: Colony Count: >=100000

## 2014-04-21 ENCOUNTER — Other Ambulatory Visit: Payer: Self-pay | Admitting: *Deleted

## 2014-04-21 MED ORDER — AMPICILLIN 250 MG PO CAPS: 250.0000 mg | ORAL_CAPSULE | Freq: Four times a day (QID) | ORAL | Status: DC

## 2014-04-22 ENCOUNTER — Encounter: Payer: Self-pay | Admitting: Gynecology

## 2014-04-26 ENCOUNTER — Encounter: Payer: Self-pay | Admitting: Gynecology

## 2014-06-03 ENCOUNTER — Ambulatory Visit (INDEPENDENT_AMBULATORY_CARE_PROVIDER_SITE_OTHER): Payer: BC Managed Care – PPO | Admitting: Gynecology

## 2014-06-03 ENCOUNTER — Encounter: Payer: Self-pay | Admitting: Gynecology

## 2014-06-03 DIAGNOSIS — IMO0002 Reserved for concepts with insufficient information to code with codable children: Secondary | ICD-10-CM

## 2014-06-03 DIAGNOSIS — R896 Abnormal cytological findings in specimens from other organs, systems and tissues: Secondary | ICD-10-CM

## 2014-06-03 NOTE — Progress Notes (Addendum)
Martha Caldwell 02-Mar-1966 [id number]        48 y.o.  G2P2 presents for colposcopy having had her first abnormal Pap smear showing ASCUS with positive high-risk HPV screen.  Past medical history,surgical history, problem list, medications, allergies, family history and social history were all reviewed and documented in the EPIC chart.  Directed ROS with pertinent positives and negatives documented in the history of present illness/assessment and plan.  Exam: [name] assistant General appearance:  Normal External BUS vagina normal. Cervix normal with IUD string visualized.  Colposcopy performed, acetic acid cleanse, adequate with no abnormality seen. ECC performed with attention to avoid the IUD string Physical Exam  Genitourinary:       Assessment/Plan:  48 y.o. 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 No new partners. Colposcopy adequate normal. ECC performed. Patient will follow up for results. Assuming negative then plan repeat Pap smear in one year. If otherwise then will triage based on results. I had a lengthy discussion with the patient about dysplasia high grade/low-grade, progression/regression and the issue of the HPV virus. Patient's questions were answered and she will follow up for the biopsy results.     Dara LordsFONTAINE,Ragena Fiola P MD, 4:11 PM 06/03/2014

## 2014-06-03 NOTE — Patient Instructions (Signed)
Office will call you with biopsy results 

## 2014-06-03 NOTE — Addendum Note (Signed)
Addended by: Dara LordsFONTAINE, Dale Ribeiro P on: 06/03/2014 04:45 PM   Modules accepted: Orders

## 2014-08-05 ENCOUNTER — Encounter: Payer: Self-pay | Admitting: Physical Medicine & Rehabilitation

## 2014-09-02 ENCOUNTER — Encounter
Payer: BLUE CROSS/BLUE SHIELD | Attending: Physical Medicine & Rehabilitation | Admitting: Physical Medicine & Rehabilitation

## 2014-09-02 ENCOUNTER — Encounter: Payer: Self-pay | Admitting: Physical Medicine & Rehabilitation

## 2014-09-02 VITALS — BP 104/70 | HR 56 | Resp 14

## 2014-09-02 DIAGNOSIS — E038 Other specified hypothyroidism: Secondary | ICD-10-CM | POA: Diagnosis not present

## 2014-09-02 DIAGNOSIS — F068 Other specified mental disorders due to known physiological condition: Secondary | ICD-10-CM | POA: Insufficient documentation

## 2014-09-02 DIAGNOSIS — E039 Hypothyroidism, unspecified: Secondary | ICD-10-CM | POA: Insufficient documentation

## 2014-09-02 DIAGNOSIS — G8114 Spastic hemiplegia affecting left nondominant side: Secondary | ICD-10-CM

## 2014-09-02 DIAGNOSIS — G8110 Spastic hemiplegia affecting unspecified side: Secondary | ICD-10-CM | POA: Insufficient documentation

## 2014-09-02 DIAGNOSIS — Z87820 Personal history of traumatic brain injury: Secondary | ICD-10-CM | POA: Insufficient documentation

## 2014-09-02 DIAGNOSIS — S069X0S Unspecified intracranial injury without loss of consciousness, sequela: Secondary | ICD-10-CM

## 2014-09-02 NOTE — Patient Instructions (Signed)
PLEASE CALL ME WITH ANY PROBLEMS OR QUESTIONS [phone]).     MELATONIN FOR SLEEP
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
## 2014-09-03 LAB — T3: T3 TOTAL: 85 ng/dL (ref 80.0–204.0)

## 2014-09-03 LAB — T4: T4, Total: 7.6 ug/dL (ref 4.5–12.0)

## 2014-09-03 LAB — TSH: TSH: 1.794 u[IU]/mL (ref 0.350–4.500)

## 2014-09-03 LAB — T4, FREE: Free T4: 1.02 ng/dL (ref 0.80–1.80)

## 2014-09-05 ENCOUNTER — Telehealth: Payer: Self-pay | Admitting: *Deleted

## 2014-09-05 NOTE — Telephone Encounter (Signed)
Calling to inquire if outpt PT will call her or does she call them.  I have informed her that they will call her and set up appt.

## 2014-09-12 ENCOUNTER — Telehealth: Payer: Self-pay | Admitting: Physical Medicine & Rehabilitation

## 2014-09-12 NOTE — Telephone Encounter (Signed)
Please let mrs Hodak know that thyroid studies are ok.  thx

## 2014-09-13 NOTE — Telephone Encounter (Signed)
Notified.  She said she has not heard from PT OT yet to start therapy.

## 2014-10-07 ENCOUNTER — Ambulatory Visit: Payer: BLUE CROSS/BLUE SHIELD | Attending: Physical Medicine & Rehabilitation

## 2014-10-07 DIAGNOSIS — R269 Unspecified abnormalities of gait and mobility: Secondary | ICD-10-CM

## 2014-10-07 DIAGNOSIS — R531 Weakness: Secondary | ICD-10-CM

## 2014-10-07 DIAGNOSIS — M6289 Other specified disorders of muscle: Secondary | ICD-10-CM | POA: Diagnosis not present

## 2014-10-07 DIAGNOSIS — Z9181 History of falling: Secondary | ICD-10-CM | POA: Insufficient documentation
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
## 2014-10-21 ENCOUNTER — Ambulatory Visit: Payer: BLUE CROSS/BLUE SHIELD | Attending: Physical Medicine & Rehabilitation

## 2014-10-21 DIAGNOSIS — R531 Weakness: Secondary | ICD-10-CM

## 2014-10-21 DIAGNOSIS — R269 Unspecified abnormalities of gait and mobility: Secondary | ICD-10-CM | POA: Diagnosis present

## 2014-10-21 DIAGNOSIS — Z9181 History of falling: Secondary | ICD-10-CM | POA: Diagnosis not present

## 2014-10-21 DIAGNOSIS — M6289 Other specified disorders of muscle: Secondary | ICD-10-CM | POA: Insufficient documentation

## 2014-10-21 NOTE — Patient Instructions (Signed)
Perform walking balance activities at counter and perform static standing exercises in a corner with a chair in front of you for safety:  Side-Stepping   Walk to left side with eyes open. Take even steps, leading with same foot. Make sure each foot lifts off the floor. Repeat in opposite direction. Repeat twice. Do __1__ sessions per day. Place hand on counter if needed.  Copyright  VHI. All rights reserved.  Backward   Walk backwards with eyes open. Take even steps, making sure each foot lifts off floor. Hold onto counter with one hand. Repeat four times. Do __1__ sessions per day.   Copyright  VHI. All rights reserved.  Side to Side Head Motion   Perform holding onto counter. Walking on solid surface, turn head and eyes to left for __2__ steps. Then, turn head and eyes straight ahead for _2___ steps. Then, turn head and eyes to opposite side for _2___ steps. Repeat sequence __4__ times per session. Do __1__ sessions per day.   Copyright  VHI. All rights reserved.  Up / Down Head Motion   Perform holding onto counter. Walking on solid surface, move head and eyes toward ceiling for __2__ steps. Then, move head and eyes straight ahead for __2__ steps. Then, move head and eyes toward floor for __2__ steps. Repeat __4__ times per session. Do _1___ sessions per day.   Copyright  VHI. All rights reserved.    Feet Heel-Toe "Tandem", Varied Arm Positions - Eyes Open   With eyes open, right foot directly in front of the other, arms at your sides, look straight ahead at a stationary object. Hold __30__ seconds. Repeat with left foot in front. Repeat _3___ times per session. Do __1__ sessions per day.  Copyright  VHI. All rights reserved.
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
## 2014-10-25 ENCOUNTER — Ambulatory Visit: Payer: BLUE CROSS/BLUE SHIELD

## 2014-10-25 DIAGNOSIS — R269 Unspecified abnormalities of gait and mobility: Secondary | ICD-10-CM

## 2014-10-25 DIAGNOSIS — R531 Weakness: Secondary | ICD-10-CM

## 2014-10-25 DIAGNOSIS — Z9181 History of falling: Secondary | ICD-10-CM

## 2014-10-25 NOTE — Patient Instructions (Signed)
For therapy: please play drums 7 days per week for 1 hour.    Miller,Jennifer L, PT, DPT   HIP: Hamstrings - Short Sitting   Rest leg on raised surface. Keep knee straight. Lift chest. Hold _20-30__ seconds. _3__ reps per set, __1-2_ sets per day, _7__ days per week  Copyright  VHI. All rights reserved.    Gastroc, Sitting (Passive)   Sit with strap or towel around ball of foot. Gently pull toward body. Hold _20-30__ seconds.  Repeat _3__ times per session. Do _1-2__ sessions per day.  Copyright  VHI. All rights reserved.    Adductor Stretch - Supine   Lie on floor, knees bent, feet flat. Keeping feet together, lower Left knee toward floor until stretch felt at inner thighs and hold for 30 seconds.Marland Kitchen. Keep Right knee bent and foot flat. Repeat _3__ times. Do _1-2__ times per day.  Copyright  VHI. All rights reserved.    Hip Flexor Stretch   Lying on back near edge of bed, bend one leg, foot flat. Hang other leg over edge, relaxed, thigh resting entirely on bed for __30 seconds. Repeat __3__ times. Do __1-2__ sessions per day. Advanced Exercise: Bend knee back keeping thigh in contact with bed.  http://gt2.exer.us/347   Copyright  VHI. All rights reserved.    FUNCTIONAL MOBILITY: Wall Squat   Stance: shoulder-width on floor, against wall. Place feet in front of hips. Bend hips and knees. Keep back straight. Do not allow knees to bend past toes and hold for 5 seconds. Squeeze glutes and quads to stand. _10__ reps per set, _1__ sets per day, _3-4__ days per week  Copyright  VHI. All rights reserved.   Abduction: Clam (Eccentric) - Side-Lying   Lie on side with knees bent. Lift top knee, keeping feet together. Keep trunk steady. Slowly lower. _10__ reps per set, _3__ sets per day, __3-4_ days per week. Add ___ lbs when you achieve ___ repetitions.  Copyright  VHI. All rights reserved.    ANKLE: Dorsiflexion (Band)   Sit at edge of surface. Place band  around top of Left foot. Keeping heel on floor, raise toes of banded foot. Hold _1-2__ seconds. Use __yellow______ band. _10__ reps per set, _3__ sets per day, _4__ days per week  Copyright  VHI. All rights reserved.
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
## 2014-10-28 ENCOUNTER — Ambulatory Visit: Payer: BLUE CROSS/BLUE SHIELD

## 2014-10-28 DIAGNOSIS — Z9181 History of falling: Secondary | ICD-10-CM

## 2014-10-28 DIAGNOSIS — R269 Unspecified abnormalities of gait and mobility: Secondary | ICD-10-CM

## 2014-10-28 DIAGNOSIS — R531 Weakness: Secondary | ICD-10-CM

## 2014-10-28 NOTE — Patient Instructions (Addendum)
Fall Prevention and Home Safety Falls cause injuries and can affect all age groups. It is possible to use preventive measures to significantly decrease the likelihood of falls. There are many simple measures which can make your home safer and prevent falls. OUTDOORS  Repair cracks and edges of walkways and driveways.  Remove high doorway thresholds.  Trim shrubbery on the main path into your home.  Have good outside lighting.  Clear walkways of tools, rocks, debris, and clutter.  Check that handrails are not broken and are securely fastened. Both sides of steps should have handrails.  Have leaves, snow, and ice cleared regularly.  Use sand or salt on walkways during winter months.  In the garage, clean up grease or oil spills. BATHROOM  Install night lights.  Install grab bars by the toilet and in the tub and shower.  Use non-skid mats or decals in the tub or shower.  Place a plastic non-slip stool in the shower to sit on, if needed.  Keep floors dry and clean up all water on the floor immediately.  Remove soap buildup in the tub or shower on a regular basis.  Secure bath mats with non-slip, double-sided rug tape.  Remove throw rugs and tripping hazards from the floors. BEDROOMS  Install night lights.  Make sure a bedside light is easy to reach.  Do not use oversized bedding.  Keep a telephone by your bedside.  Have a firm chair with side arms to use for getting dressed.  Remove throw rugs and tripping hazards from the floor. KITCHEN  Keep handles on pots and pans turned toward the center of the stove. Use back burners when possible.  Clean up spills quickly and allow time for drying.  Avoid walking on wet floors.  Avoid hot utensils and knives.  Position shelves so they are not too high or low.  Place commonly used objects within easy reach.  If necessary, use a sturdy step stool with a grab bar when reaching.  Keep electrical cables out of the  way.  Do not use floor polish or wax that makes floors slippery. If you must use wax, use non-skid floor wax.  Remove throw rugs and tripping hazards from the floor. STAIRWAYS  Never leave objects on stairs.  Place handrails on both sides of stairways and use them. Fix any loose handrails. Make sure handrails on both sides of the stairways are as long as the stairs.  Check carpeting to make sure it is firmly attached along stairs. Make repairs to worn or loose carpet promptly.  Avoid placing throw rugs at the top or bottom of stairways, or properly secure the rug with carpet tape to prevent slippage. Get rid of throw rugs, if possible.  Have an electrician put in a light switch at the top and bottom of the stairs. OTHER FALL PREVENTION TIPS  Wear low-heel or rubber-soled shoes that are supportive and fit well. Wear closed toe shoes.  When using a stepladder, make sure it is fully opened and both spreaders are firmly locked. Do not climb a closed stepladder.  Add color or contrast paint or tape to grab bars and handrails in your home. Place contrasting color strips on first and last steps.  Learn and use mobility aids as needed. Install an electrical emergency response system.  Turn on lights to avoid dark areas. Replace light bulbs that burn out immediately. Get light switches that glow.  Arrange furniture to create clear pathways. Keep furniture in the same place.    Firmly attach carpet with non-skid or double-sided tape.  Eliminate uneven floor surfaces.  Select a carpet pattern that does not visually hide the edge of steps.  Be aware of all pets. OTHER HOME SAFETY TIPS  Set the water temperature for 120 F (48.8 C).  Keep emergency numbers on or near the telephone.  Keep smoke detectors on every level of the home and near sleeping areas. Document Released: 05/24/2002 Document Revised: 12/03/2011 Document Reviewed: 08/23/2011 [facility] Patient Information 2015  [place]. This information is not intended to replace advice given to you by your health care provider. Make sure you discuss any questions you have with your health care provider.               Flexion: Stretch - Quadriceps (Prone)   Position Helper: Place one hand on shin near left ankle. Stabilize buttock to keep hip flat on bed. Motion - Helper presses heel toward buttock slowly. CAUTION: Patient should feel stretch along front of thigh. There should be no pain in knee joint. Hold _30__ seconds. Repeat __3_ times. Repeat with other leg. Do _1-2__ sessions per day.  Copyright  VHI. All rights reserved.
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
## 2014-11-01 ENCOUNTER — Ambulatory Visit: Payer: BLUE CROSS/BLUE SHIELD

## 2014-11-02 ENCOUNTER — Ambulatory Visit: Payer: BLUE CROSS/BLUE SHIELD

## 2014-11-02 DIAGNOSIS — Z9181 History of falling: Secondary | ICD-10-CM

## 2014-11-02 DIAGNOSIS — R269 Unspecified abnormalities of gait and mobility: Secondary | ICD-10-CM

## 2014-11-02 DIAGNOSIS — R531 Weakness: Secondary | ICD-10-CM
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
## 2014-11-04 ENCOUNTER — Encounter
Payer: BLUE CROSS/BLUE SHIELD | Attending: Physical Medicine & Rehabilitation | Admitting: Physical Medicine & Rehabilitation

## 2014-11-04 ENCOUNTER — Ambulatory Visit: Payer: BLUE CROSS/BLUE SHIELD

## 2014-11-04 ENCOUNTER — Encounter: Payer: Self-pay | Admitting: Physical Medicine & Rehabilitation

## 2014-11-04 VITALS — BP 103/73 | HR 60 | Resp 14

## 2014-11-04 DIAGNOSIS — E038 Other specified hypothyroidism: Secondary | ICD-10-CM | POA: Insufficient documentation

## 2014-11-04 DIAGNOSIS — R269 Unspecified abnormalities of gait and mobility: Secondary | ICD-10-CM

## 2014-11-04 DIAGNOSIS — Z87820 Personal history of traumatic brain injury: Secondary | ICD-10-CM

## 2014-11-04 DIAGNOSIS — S069X0S Unspecified intracranial injury without loss of consciousness, sequela: Secondary | ICD-10-CM

## 2014-11-04 DIAGNOSIS — Z9181 History of falling: Secondary | ICD-10-CM

## 2014-11-04 DIAGNOSIS — F068 Other specified mental disorders due to known physiological condition: Secondary | ICD-10-CM | POA: Diagnosis not present

## 2014-11-04 DIAGNOSIS — G8110 Spastic hemiplegia affecting unspecified side: Secondary | ICD-10-CM | POA: Insufficient documentation

## 2014-11-04 DIAGNOSIS — R4189 Other symptoms and signs involving cognitive functions and awareness: Secondary | ICD-10-CM

## 2014-11-04 DIAGNOSIS — G8114 Spastic hemiplegia affecting left nondominant side: Secondary | ICD-10-CM

## 2014-11-04 DIAGNOSIS — R531 Weakness: Secondary | ICD-10-CM

## 2014-11-04 MED ORDER — LEVOTHYROXINE SODIUM 25 MCG PO TABS: 25.0000 ug | ORAL_TABLET | Freq: Every day | ORAL | Status: DC
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
## 2014-11-04 NOTE — Patient Instructions (Signed)
CONTINUE WORKING  ON YOUR RANGE OF MOTION, STRENGTH, AND TECHNIQUE
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
## 2014-11-08 ENCOUNTER — Ambulatory Visit: Payer: BLUE CROSS/BLUE SHIELD

## 2014-11-09 ENCOUNTER — Telehealth: Payer: Self-pay

## 2014-11-09 ENCOUNTER — Ambulatory Visit: Payer: BLUE CROSS/BLUE SHIELD

## 2014-11-09 NOTE — Telephone Encounter (Signed)
Spoke with Martha Caldwell regarding L toe flexion issue and her appt. With Dr. Riley KillSwartz per Martha Caldwell request. Martha Caldwell's saw Dr. Riley KillSwartz on Friday and she stated that he wants to trial botox in L LE to decrease tone. Per pt, MD told Martha Caldwell to hold off on Martha Caldwell right now and to try Martha Caldwell after botox. Martha Caldwell will place Martha Caldwell on hold and f/u in 2 weeks if Martha Caldwell has not called back.

## 2014-11-16 ENCOUNTER — Ambulatory Visit: Payer: BLUE CROSS/BLUE SHIELD

## 2014-11-18 ENCOUNTER — Ambulatory Visit: Payer: BLUE CROSS/BLUE SHIELD

## 2014-11-22 ENCOUNTER — Ambulatory Visit: Payer: BLUE CROSS/BLUE SHIELD

## 2014-12-02 ENCOUNTER — Telehealth: Payer: Self-pay

## 2014-12-02 NOTE — Telephone Encounter (Signed)
PT called pt regarding if MD and pt decided on continuing PT or discharging. Pt reported she has not heard back from MD and will call their office and then call PT back.

## 2014-12-12 ENCOUNTER — Ambulatory Visit: Payer: BLUE CROSS/BLUE SHIELD | Admitting: Physical Medicine & Rehabilitation

## 2014-12-22 NOTE — Therapy (Signed)
[place] [address] [place], [place], [address] Phone: [phone]   Fax:  [phone]  Patient Details  Name: Martha Caldwell MRN: [id number] Date of Birth: [birth year] Referring Provider:  No ref. provider found  Encounter Date: 12/22/2014  PHYSICAL THERAPY DISCHARGE SUMMARY  Visits from Start of Care: 6  Current functional level related to goals / functional outcomes:     PT Short Term Goals - 11/04/14 1641    PT SHORT TERM GOAL #1   Title Pt will be independent in HEP to improve strength, endurance, balance, and safety during functional mobility. Target date: 11/04/14.   Status On-going   PT SHORT TERM GOAL #2   Title Pt will verbalize fall prevention strategies to decrease falls risk. Target date: 11/04/14.   Status Achieved         PT Long Term Goals - 11/04/14 1642    PT LONG TERM GOAL #1   Title Pt will ambulate 1000' with LRAD, at MOD I level, over even/uneven terrain to improve functional mobility. Target date: 12/02/14.   Status Not Met   PT LONG TERM GOAL #2   Title Pt will improve BERG score to >/=52/56 to be at a low risk for falls. Target date: 12/02/14.   Status Achieved   PT LONG TERM GOAL #3   Title Pt will ascend/descend 4 steps without handrail, independently, to improve functional mobility in the community. Target date: 12/02/14.   Status Not Met   PT LONG TERM GOAL #4   Title Pt will improve gait speed with LRAD to >/=2.57f/sec. to ambulate safely in the community. Target date: 12/02/14.   Status Partially Met   PT LONG TERM GOAL #5   Title Pt will report no falls in the last 4 weeks to improve safety during functional moblity. Target date: 12/02/14.   Status Achieved   PT LONG TERM GOAL #6   Title Pt will improve FOTO score by 10 points to improve quality of life. Target date: 12/02/14.   Status On-going        Remaining deficits: Unknown, as pt did not return since last visit. Pt  wanted to hold PT until she spoke with MD. PT called pt approx. 1 month ago and pt was still not sure what MD wanted pt to do. [place]. If pt wishes to return to PT, she will need a new order.   Education / Equipment: HEP and heel wedge  Plan: Patient agrees to discharge.  Patient goals were partially met. Patient is being discharged due to not returning since the last visit.  ?????       Miller,Jennifer L 12/22/2014, 10:52 AM  [place] [address]: [phone]  Fax:  [phone]  JGeoffry Paradise PT,DPT 12/22/2014 10:52 AM Phone: [phone]: [phone]

## 2015-04-20 ENCOUNTER — Ambulatory Visit (INDEPENDENT_AMBULATORY_CARE_PROVIDER_SITE_OTHER): Payer: BLUE CROSS/BLUE SHIELD | Admitting: Gynecology

## 2015-04-20 ENCOUNTER — Other Ambulatory Visit (HOSPITAL_COMMUNITY)
Admission: RE | Admit: 2015-04-20 | Discharge: 2015-04-20 | Disposition: A | Discharge status: Home / Self Care | Payer: BLUE CROSS/BLUE SHIELD | Source: Ambulatory Visit | Attending: Gynecology | Admitting: Gynecology

## 2015-04-20 ENCOUNTER — Encounter: Payer: Self-pay | Admitting: Gynecology

## 2015-04-20 VITALS — BP 110/60 | Ht 65.0 in | Wt 127.0 lb

## 2015-04-20 DIAGNOSIS — R87811 Vaginal high risk human papillomavirus (HPV) DNA test positive: Secondary | ICD-10-CM | POA: Diagnosis not present

## 2015-04-20 DIAGNOSIS — Z30431 Encounter for routine checking of intrauterine contraceptive device: Secondary | ICD-10-CM

## 2015-04-20 DIAGNOSIS — Z01411 Encounter for gynecological examination (general) (routine) with abnormal findings: Secondary | ICD-10-CM | POA: Diagnosis present

## 2015-04-20 DIAGNOSIS — Z01419 Encounter for gynecological examination (general) (routine) without abnormal findings: Secondary | ICD-10-CM

## 2015-04-20 DIAGNOSIS — Z1151 Encounter for screening for human papillomavirus (HPV): Secondary | ICD-10-CM | POA: Diagnosis not present

## 2015-04-20 DIAGNOSIS — R87620 Atypical squamous cells of undetermined significance on cytologic smear of vagina (ASC-US): Secondary | ICD-10-CM

## 2015-04-20 LAB — CBC WITH DIFFERENTIAL/PLATELET
BASOS ABS: 0 10*3/uL (ref 0.0–0.1)
BASOS PCT: 0 % (ref 0–1)
EOS ABS: 0.1 10*3/uL (ref 0.0–0.7)
Eosinophils Relative: 2 % (ref 0–5)
HCT: 37.9 % (ref 36.0–46.0)
Hemoglobin: 12.5 g/dL (ref 12.0–15.0)
LYMPHS ABS: 1.2 10*3/uL (ref 0.7–4.0)
Lymphocytes Relative: 26 % (ref 12–46)
MCH: 31.8 pg (ref 26.0–34.0)
MCHC: 33 g/dL (ref 30.0–36.0)
MCV: 96.4 fL (ref 78.0–100.0)
MPV: 9.9 fL (ref 8.6–12.4)
Monocytes Absolute: 0.4 10*3/uL (ref 0.1–1.0)
Monocytes Relative: 9 % (ref 3–12)
NEUTROS PCT: 63 % (ref 43–77)
Neutro Abs: 2.8 10*3/uL (ref 1.7–7.7)
Platelets: 228 10*3/uL (ref 150–400)
RBC: 3.93 MIL/uL (ref 3.87–5.11)
RDW: 13.2 % (ref 11.5–15.5)
WBC: 4.5 10*3/uL (ref 4.0–10.5)

## 2015-04-20 LAB — COMPREHENSIVE METABOLIC PANEL
ALT: 9 U/L (ref 6–29)
AST: 18 U/L (ref 10–35)
Albumin: 4.1 g/dL (ref 3.6–5.1)
Alkaline Phosphatase: 33 U/L (ref 33–115)
BILIRUBIN TOTAL: 0.8 mg/dL (ref 0.2–1.2)
BUN: 10 mg/dL (ref 7–25)
CO2: 28 mmol/L (ref 20–31)
Calcium: 9 mg/dL (ref 8.6–10.2)
Chloride: 106 mmol/L (ref 98–110)
Creat: 0.62 mg/dL (ref 0.50–1.10)
GLUCOSE: 54 mg/dL — AB (ref 65–99)
Potassium: 3.8 mmol/L (ref 3.5–5.3)
SODIUM: 141 mmol/L (ref 135–146)
Total Protein: 6.3 g/dL (ref 6.1–8.1)

## 2015-04-20 NOTE — Addendum Note (Signed)
Addended by: Dayna BarkerGARDNER, Neida Ellegood K on: 04/20/2015 10:46 AM   Modules accepted: Orders

## 2015-04-20 NOTE — Progress Notes (Signed)
Weston BrassMelissa D Citro 03/02/1966 [id number]        49 y.o.  G2P2  No LMP recorded. Patient is not currently having periods (Reason: IUD). for annual exam.  Several issues noted below.  Past medical history,surgical history, problem list, medications, allergies, family history and social history were all reviewed and documented as reviewed in the EPIC chart.  ROS:  Performed with pertinent positives and negatives included in the history, assessment and plan.   Additional significant findings :  none   Exam: [name] personassistant Filed Vitals:   04/20/15 0948  BP: 110/60  Height: 5\' 5"  (1.651 m)  Weight: 127 lb (57.607 kg)   General appearance:  Normal affect, orientation and appearance. Skin: Grossly normal HEENT: Without gross lesions.  No cervical or supraclavicular adenopathy. Thyroid normal.  Lungs:  Clear without wheezing, rales or rhonchi Cardiac: RR, without RMG Abdominal:  Soft, nontender, without masses, guarding, rebound, organomegaly or hernia Breasts:  Examined lying and sitting without masses, retractions, discharge or axillary adenopathy.  Bilateral implants noted Pelvic:  Ext/BUS/vagina normal  Cervix normal with IUD string visualized. Pap/HPV  Uterus anteverted, normal size, shape and contour, midline and mobile nontender   Adnexa  Without masses or tenderness    Anus and perineum  Normal   Rectovaginal  Normal sphincter tone without palpated masses or tenderness.    Assessment/Plan:  49 y.o. G2P2 female for annual exam with scant menses, Mirena IUD.   1. Mirena IUD 07/2013. Doing well with scant menses. IUD string visualized. 2. History of ASCUS positive high-risk HPV last year as first abnormal Pap smear. Colposcopy was adequate normal with negative ECC. Pap smear/HPV today. 3. Mammography scheduled today. SBE monthly reviewed. 4. Health maintenance.  CBC, comprehensive metabolic panel, urinalysis ordered. Lipid profile last year excellent with cholesterol 114 LDL 65 HDL  40. Not repeated this year. Follow up 1 year, sooner as needed.   Dara LordsFONTAINE,TIMOTHY P MD, 10:08 AM 04/20/2015

## 2015-04-20 NOTE — Patient Instructions (Signed)

## 2015-04-21 LAB — URINALYSIS W MICROSCOPIC + REFLEX CULTURE
BACTERIA UA: NONE SEEN [HPF]
BILIRUBIN URINE: NEGATIVE
CRYSTALS: NONE SEEN [HPF]
Casts: NONE SEEN [LPF]
GLUCOSE, UA: NEGATIVE
KETONES UR: NEGATIVE
Nitrite: NEGATIVE
PROTEIN: NEGATIVE
Specific Gravity, Urine: 1.009 (ref 1.001–1.035)
Yeast: NONE SEEN [HPF]
pH: 8 (ref 5.0–8.0)

## 2015-04-21 LAB — CYTOLOGY - PAP

## 2015-04-22 LAB — URINE CULTURE
Colony Count: NO GROWTH
ORGANISM ID, BACTERIA: NO GROWTH

## 2015-04-24 ENCOUNTER — Encounter: Payer: Self-pay | Admitting: Gynecology

## 2015-10-26 ENCOUNTER — Other Ambulatory Visit: Payer: Self-pay

## 2016-04-25 ENCOUNTER — Encounter: Payer: Self-pay | Admitting: Gynecology

## 2016-04-25 ENCOUNTER — Ambulatory Visit (INDEPENDENT_AMBULATORY_CARE_PROVIDER_SITE_OTHER): Payer: BLUE CROSS/BLUE SHIELD | Admitting: Gynecology

## 2016-04-25 VITALS — BP 118/76 | Ht 65.0 in | Wt 124.0 lb

## 2016-04-25 DIAGNOSIS — Z01419 Encounter for gynecological examination (general) (routine) without abnormal findings: Secondary | ICD-10-CM | POA: Diagnosis not present

## 2016-04-25 DIAGNOSIS — Z13220 Encounter for screening for lipoid disorders: Secondary | ICD-10-CM | POA: Diagnosis not present

## 2016-04-25 DIAGNOSIS — Z30431 Encounter for routine checking of intrauterine contraceptive device: Secondary | ICD-10-CM | POA: Diagnosis not present

## 2016-04-25 LAB — LIPID PANEL
CHOL/HDL RATIO: 2.9 ratio (ref <5.0)
CHOLESTEROL: 132 mg/dL (ref <200)
HDL: 46 mg/dL — AB (ref >50)
LDL Cholesterol: 75 mg/dL
TRIGLYCERIDES: 53 mg/dL (ref <150)
VLDL: 11 mg/dL (ref <30)

## 2016-04-25 LAB — COMPREHENSIVE METABOLIC PANEL
ALK PHOS: 29 U/L — AB (ref 33–115)
ALT: 10 U/L (ref 6–29)
AST: 16 U/L (ref 10–35)
Albumin: 4.5 g/dL (ref 3.6–5.1)
BILIRUBIN TOTAL: 1.4 mg/dL — AB (ref 0.2–1.2)
BUN: 15 mg/dL (ref 7–25)
CO2: 27 mmol/L (ref 20–31)
CREATININE: 0.76 mg/dL (ref 0.50–1.10)
Calcium: 9.7 mg/dL (ref 8.6–10.2)
Chloride: 105 mmol/L (ref 98–110)
Glucose, Bld: 67 mg/dL (ref 65–99)
POTASSIUM: 4 mmol/L (ref 3.5–5.3)
SODIUM: 141 mmol/L (ref 135–146)
TOTAL PROTEIN: 6.7 g/dL (ref 6.1–8.1)

## 2016-04-25 LAB — CBC WITH DIFFERENTIAL/PLATELET
Basophils Absolute: 35 cells/uL (ref 0–200)
Basophils Relative: 1 %
EOS PCT: 1 %
Eosinophils Absolute: 35 cells/uL (ref 15–500)
HEMATOCRIT: 40.8 % (ref 35.0–45.0)
HEMOGLOBIN: 13.3 g/dL (ref 11.7–15.5)
LYMPHS PCT: 34 %
Lymphs Abs: 1190 cells/uL (ref 850–3900)
MCH: 31.7 pg (ref 27.0–33.0)
MCHC: 32.6 g/dL (ref 32.0–36.0)
MCV: 97.1 fL (ref 80.0–100.0)
MONO ABS: 315 {cells}/uL (ref 200–950)
MPV: 9.9 fL (ref 7.5–12.5)
Monocytes Relative: 9 %
NEUTROS PCT: 55 %
Neutro Abs: 1925 cells/uL (ref 1500–7800)
Platelets: 200 10*3/uL (ref 140–400)
RBC: 4.2 MIL/uL (ref 3.80–5.10)
RDW: 13.4 % (ref 11.0–15.0)
WBC: 3.5 10*3/uL — AB (ref 3.8–10.8)

## 2016-04-25 NOTE — Patient Instructions (Signed)

## 2016-04-25 NOTE — Progress Notes (Signed)
    Martha Caldwell 1966-04-08 [id number]        50 y.o.  G2P2  for annual exam.  Doing well without complaints.  Past medical history,surgical history, problem list, medications, allergies, family history and social history were all reviewed and documented as reviewed in the EPIC chart.  ROS:  Performed with pertinent positives and negatives included in the history, assessment and plan.   Additional significant findings :  None   Exam: Kennon PortelaKim Gardner assistant Vitals:   04/25/16 0922  BP: 118/76  Weight: 124 lb (56.2 kg)  Height: 5\' 5"  (1.651 m)   Body mass index is 20.63 kg/m.  General appearance:  Normal affect, orientation and appearance. Skin: Grossly normal HEENT: Without gross lesions.  No cervical or supraclavicular adenopathy. Thyroid normal.  Lungs:  Clear without wheezing, rales or rhonchi Cardiac: RR, without RMG Abdominal:  Soft, nontender, without masses, guarding, rebound, organomegaly or hernia Breasts:  Examined lying and sitting without masses, retractions, discharge or axillary adenopathy. Pelvic:  Ext, BUS, Vagina normal  Cervix normal. IUD string visualized  Uterus anteverted, normal size, shape and contour, midline and mobile nontender   Adnexa without masses or tenderness    Anus and perineum normal   Rectovaginal normal sphincter tone without palpated masses or tenderness.    Assessment/Plan:  50 y.o. G2P2 female for annual exam without menses, Mirena IUD.   1. Mirena IUD 07/2013. Doing well with scant spotting. IUD string visualized. 2. History of ASCUS positive high-risk HPV 2015. Colposcopy was adequate and normal with negative ECC. Follow up Pap smear 2016 normal with negative HPV. No Pap smear done today. Will plan Pap smear next year with increased surveillance given history. 3. Mammography scheduled today. SBE monthly reviewed. 4. Health maintenance. CBC, CMP, lipid profile, urinalysis ordered. Follow up 1 year, sooner as  needed.   Dara LordsFONTAINE,Istvan Behar P MD, 9:39 AM 04/25/2016

## 2016-04-26 LAB — URINALYSIS W MICROSCOPIC + REFLEX CULTURE
BILIRUBIN URINE: NEGATIVE
Bacteria, UA: NONE SEEN [HPF]
Casts: NONE SEEN [LPF]
Crystals: NONE SEEN [HPF]
Glucose, UA: NEGATIVE
Hgb urine dipstick: NEGATIVE
KETONES UR: NEGATIVE
NITRITE: NEGATIVE
PH: 7.5 (ref 5.0–8.0)
Protein, ur: NEGATIVE
RBC / HPF: NONE SEEN RBC/HPF (ref <=2)
SPECIFIC GRAVITY, URINE: 1.008 (ref 1.001–1.035)
WBC UA: NONE SEEN WBC/HPF (ref <=5)
Yeast: NONE SEEN [HPF]

## 2016-04-28 LAB — URINE CULTURE

## 2016-08-16 ENCOUNTER — Encounter (HOSPITAL_COMMUNITY): Payer: Self-pay | Admitting: *Deleted

## 2016-08-19 ENCOUNTER — Ambulatory Visit (HOSPITAL_COMMUNITY)
Admission: RE | Admit: 2016-08-19 | Discharge: 2016-08-19 | Disposition: A | Discharge status: Home / Self Care | Payer: BLUE CROSS/BLUE SHIELD | Source: Ambulatory Visit | Attending: Orthopedic Surgery | Admitting: Orthopedic Surgery

## 2016-08-19 ENCOUNTER — Ambulatory Visit (HOSPITAL_COMMUNITY): Payer: BLUE CROSS/BLUE SHIELD | Admitting: Certified Registered"

## 2016-08-19 ENCOUNTER — Ambulatory Visit (HOSPITAL_COMMUNITY): Payer: BLUE CROSS/BLUE SHIELD

## 2016-08-19 ENCOUNTER — Encounter (HOSPITAL_COMMUNITY)
Admission: RE | Disposition: A | Discharge status: Home / Self Care | Payer: Self-pay | Source: Ambulatory Visit | Attending: Orthopedic Surgery

## 2016-08-19 ENCOUNTER — Encounter (HOSPITAL_COMMUNITY): Payer: Self-pay | Admitting: Certified Registered"

## 2016-08-19 DIAGNOSIS — Z79899 Other long term (current) drug therapy: Secondary | ICD-10-CM | POA: Insufficient documentation

## 2016-08-19 DIAGNOSIS — Z7982 Long term (current) use of aspirin: Secondary | ICD-10-CM | POA: Insufficient documentation

## 2016-08-19 DIAGNOSIS — S42202A Unspecified fracture of upper end of left humerus, initial encounter for closed fracture: Secondary | ICD-10-CM | POA: Diagnosis present

## 2016-08-19 DIAGNOSIS — Y9323 Activity, snow (alpine) (downhill) skiing, snow boarding, sledding, tobogganing and snow tubing: Secondary | ICD-10-CM | POA: Diagnosis not present

## 2016-08-19 DIAGNOSIS — Z9889 Other specified postprocedural states: Secondary | ICD-10-CM

## 2016-08-19 DIAGNOSIS — Z8781 Personal history of (healed) traumatic fracture: Secondary | ICD-10-CM

## 2016-08-19 HISTORY — PX: ORIF HUMERUS FRACTURE: SHX2126

## 2016-08-19 HISTORY — DX: Unspecified intracranial injury with loss of consciousness of unspecified duration, initial encounter: S06.9X9A

## 2016-08-19 LAB — CBC
HEMATOCRIT: 38.3 % (ref 36.0–46.0)
Hemoglobin: 12.9 g/dL (ref 12.0–15.0)
MCH: 31.7 pg (ref 26.0–34.0)
MCHC: 33.7 g/dL (ref 30.0–36.0)
MCV: 94.1 fL (ref 78.0–100.0)
Platelets: 206 10*3/uL (ref 150–400)
RBC: 4.07 MIL/uL (ref 3.87–5.11)
RDW: 12.7 % (ref 11.5–15.5)
WBC: 4.5 10*3/uL (ref 4.0–10.5)

## 2016-08-19 LAB — HCG, SERUM, QUALITATIVE: Preg, Serum: NEGATIVE

## 2016-08-19 SURGERY — OPEN REDUCTION INTERNAL FIXATION (ORIF) PROXIMAL HUMERUS FRACTURE
Anesthesia: Choice | Site: Arm Upper | Laterality: Left

## 2016-08-19 MED ORDER — LIDOCAINE HCL (CARDIAC) 20 MG/ML IV SOLN
INTRAVENOUS | Status: DC | PRN
  Administered 2016-08-19: 25 mg via INTRAVENOUS

## 2016-08-19 MED ORDER — PHENYLEPHRINE HCL 10 MG/ML IJ SOLN
INTRAVENOUS | Status: DC | PRN
  Administered 2016-08-19: 25 ug/min via INTRAVENOUS

## 2016-08-19 MED ORDER — PROPOFOL 10 MG/ML IV BOLUS
INTRAVENOUS | Status: AC
  Filled 2016-08-19: qty 20

## 2016-08-19 MED ORDER — FENTANYL CITRATE (PF) 100 MCG/2ML IJ SOLN
INTRAMUSCULAR | Status: DC | PRN
  Administered 2016-08-19: 75 ug via INTRAVENOUS
  Administered 2016-08-19: 25 ug via INTRAVENOUS

## 2016-08-19 MED ORDER — ONDANSETRON HCL 4 MG/2ML IJ SOLN: 4.0000 mg | Freq: Once | INTRAMUSCULAR | Status: DC | PRN

## 2016-08-19 MED ORDER — FENTANYL CITRATE (PF) 100 MCG/2ML IJ SOLN
INTRAMUSCULAR | Status: AC
  Filled 2016-08-19: qty 2

## 2016-08-19 MED ORDER — ONDANSETRON HCL 4 MG/2ML IJ SOLN
INTRAMUSCULAR | Status: DC | PRN
Start: 2016-08-19 — End: 2016-08-19
  Administered 2016-08-19: 4 mg via INTRAVENOUS

## 2016-08-19 MED ORDER — LACTATED RINGERS IV SOLN
INTRAVENOUS | Status: DC | PRN
  Administered 2016-08-19 (×2): via INTRAVENOUS

## 2016-08-19 MED ORDER — PHENYLEPHRINE 40 MCG/ML (10ML) SYRINGE FOR IV PUSH (FOR BLOOD PRESSURE SUPPORT)
PREFILLED_SYRINGE | INTRAVENOUS | Status: AC
  Filled 2016-08-19: qty 10

## 2016-08-19 MED ORDER — EPHEDRINE SULFATE 50 MG/ML IJ SOLN
INTRAMUSCULAR | Status: DC | PRN
  Administered 2016-08-19: 5 mg via INTRAVENOUS

## 2016-08-19 MED ORDER — LIDOCAINE 2% (20 MG/ML) 5 ML SYRINGE
INTRAMUSCULAR | Status: AC
  Filled 2016-08-19: qty 5

## 2016-08-19 MED ORDER — PROPOFOL 10 MG/ML IV BOLUS
INTRAVENOUS | Status: DC | PRN
  Administered 2016-08-19: 130 mg via INTRAVENOUS

## 2016-08-19 MED ORDER — OXYCODONE HCL 5 MG PO TABS: 5.0000 mg | ORAL_TABLET | ORAL | 0 refills | Status: DC | PRN

## 2016-08-19 MED ORDER — ROCURONIUM BROMIDE 100 MG/10ML IV SOLN
INTRAVENOUS | Status: DC | PRN
  Administered 2016-08-19: 50 mg via INTRAVENOUS

## 2016-08-19 MED ORDER — OXYCODONE HCL 5 MG PO TABS: 5.0000 mg | ORAL_TABLET | Freq: Once | ORAL | Status: DC | PRN

## 2016-08-19 MED ORDER — FENTANYL CITRATE (PF) 100 MCG/2ML IJ SOLN
INTRAMUSCULAR | Status: AC
  Administered 2016-08-19: 50 ug
  Filled 2016-08-19: qty 2

## 2016-08-19 MED ORDER — EPHEDRINE 5 MG/ML INJ
INTRAVENOUS | Status: AC
  Filled 2016-08-19: qty 10

## 2016-08-19 MED ORDER — FENTANYL CITRATE (PF) 100 MCG/2ML IJ SOLN: 25.0000 ug | INTRAMUSCULAR | Status: DC | PRN

## 2016-08-19 MED ORDER — ONDANSETRON HCL 4 MG/2ML IJ SOLN
INTRAMUSCULAR | Status: AC
  Filled 2016-08-19: qty 2

## 2016-08-19 MED ORDER — DEXAMETHASONE SODIUM PHOSPHATE 10 MG/ML IJ SOLN
INTRAMUSCULAR | Status: AC
  Filled 2016-08-19: qty 1

## 2016-08-19 MED ORDER — OXYCODONE HCL 5 MG/5ML PO SOLN: 5.0000 mg | Freq: Once | ORAL | Status: DC | PRN

## 2016-08-19 MED ORDER — MIDAZOLAM HCL 2 MG/2ML IJ SOLN
INTRAMUSCULAR | Status: AC
  Administered 2016-08-19: 1 mg
  Filled 2016-08-19: qty 2

## 2016-08-19 MED ORDER — ROCURONIUM BROMIDE 50 MG/5ML IV SOSY
PREFILLED_SYRINGE | INTRAVENOUS | Status: AC
  Filled 2016-08-19: qty 5

## 2016-08-19 MED ORDER — DEXAMETHASONE SODIUM PHOSPHATE 10 MG/ML IJ SOLN
INTRAMUSCULAR | Status: DC | PRN
  Administered 2016-08-19: 10 mg via INTRAVENOUS

## 2016-08-19 MED ORDER — 0.9 % SODIUM CHLORIDE (POUR BTL) OPTIME
TOPICAL | Status: DC | PRN
  Administered 2016-08-19: 1000 mL

## 2016-08-19 MED ORDER — SUGAMMADEX SODIUM 200 MG/2ML IV SOLN
INTRAVENOUS | Status: AC
  Filled 2016-08-19: qty 2

## 2016-08-19 MED ORDER — CEFAZOLIN SODIUM-DEXTROSE 2-4 GM/100ML-% IV SOLN
2.0000 g | INTRAVENOUS | Status: AC
  Administered 2016-08-19: 2 g via INTRAVENOUS
  Filled 2016-08-19: qty 100

## 2016-08-19 MED ORDER — CHLORHEXIDINE GLUCONATE 4 % EX LIQD: 60.0000 mL | Freq: Once | CUTANEOUS | Status: DC

## 2016-08-19 MED ORDER — SUGAMMADEX SODIUM 200 MG/2ML IV SOLN
INTRAVENOUS | Status: DC | PRN
  Administered 2016-08-19: 111.6 mg via INTRAVENOUS

## 2016-08-19 MED ORDER — ONDANSETRON 4 MG PO TBDP: 4.0000 mg | ORAL_TABLET | Freq: Three times a day (TID) | ORAL | 0 refills | Status: DC | PRN

## 2016-08-19 MED ORDER — PHENYLEPHRINE HCL 10 MG/ML IJ SOLN
INTRAMUSCULAR | Status: DC | PRN
  Administered 2016-08-19 (×3): 80 ug via INTRAVENOUS

## 2016-08-19 SURGICAL SUPPLY — 70 items
BENZOIN TINCTURE PRP APPL 2/3 (GAUZE/BANDAGES/DRESSINGS) ×2 IMPLANT
BIT DRILL 3.2 (BIT) ×1
BIT DRILL 3.2XCALB NS DISP (BIT) ×1 IMPLANT
BIT DRILL CALIBRATED 2.7 (BIT) ×2 IMPLANT
BIT DRL 3.2XCALB NS DISP (BIT) ×1
BONE VOID FILLER CERAMENT (Orthopedic Implant) ×2 IMPLANT
COVER SURGICAL LIGHT HANDLE (MISCELLANEOUS) ×2 IMPLANT
DRAPE C-ARM 42X72 X-RAY (DRAPES) ×2 IMPLANT
DRAPE IMP U-DRAPE 54X76 (DRAPES) ×2 IMPLANT
DRAPE INCISE IOBAN 66X45 STRL (DRAPES) ×2 IMPLANT
DRAPE U-SHAPE 47X51 STRL (DRAPES) ×2 IMPLANT
DRSG AQUACEL AG ADV 3.5X 6 (GAUZE/BANDAGES/DRESSINGS) IMPLANT
DRSG AQUACEL AG ADV 3.5X10 (GAUZE/BANDAGES/DRESSINGS) ×2 IMPLANT
DURAPREP 26ML APPLICATOR (WOUND CARE) ×2 IMPLANT
ELECT REM PT RETURN 9FT ADLT (ELECTROSURGICAL)
ELECTRODE REM PT RTRN 9FT ADLT (ELECTROSURGICAL) IMPLANT
GAUZE SPONGE 4X4 12PLY STRL (GAUZE/BANDAGES/DRESSINGS) ×2 IMPLANT
GAUZE XEROFORM 1X8 LF (GAUZE/BANDAGES/DRESSINGS) ×2 IMPLANT
GLOVE BIO SURGEON STRL SZ7.5 (GLOVE) ×2 IMPLANT
GLOVE BIOGEL PI IND STRL 8 (GLOVE) ×1 IMPLANT
GLOVE BIOGEL PI INDICATOR 8 (GLOVE) ×1
GOWN STRL REUS W/ TWL LRG LVL3 (GOWN DISPOSABLE) ×1 IMPLANT
GOWN STRL REUS W/ TWL XL LVL3 (GOWN DISPOSABLE) ×2 IMPLANT
GOWN STRL REUS W/TWL LRG LVL3 (GOWN DISPOSABLE) ×1
GOWN STRL REUS W/TWL XL LVL3 (GOWN DISPOSABLE) ×2
K-WIRE 2X5 SS THRDED S3 (WIRE) ×2
KIT BASIN OR (CUSTOM PROCEDURE TRAY) ×2 IMPLANT
KIT ROOM TURNOVER OR (KITS) ×2 IMPLANT
KWIRE 2X5 SS THRDED S3 (WIRE) ×1 IMPLANT
MANIFOLD NEPTUNE II (INSTRUMENTS) ×2 IMPLANT
NEEDLE HYPO 25GX1X1/2 BEV (NEEDLE) ×2 IMPLANT
NS IRRIG 1000ML POUR BTL (IV SOLUTION) ×2 IMPLANT
PACK SHOULDER (CUSTOM PROCEDURE TRAY) ×2 IMPLANT
PACK UNIVERSAL I (CUSTOM PROCEDURE TRAY) ×2 IMPLANT
PAD ARMBOARD 7.5X6 YLW CONV (MISCELLANEOUS) ×4 IMPLANT
PEG LOCKING 3.2MMX44 (Peg) ×2 IMPLANT
PEG LOCKING 3.2MMX46 (Peg) ×2 IMPLANT
PEG LOCKING 3.2X32 (Peg) ×2 IMPLANT
PEG LOCKING 3.2X36 (Screw) ×2 IMPLANT
PEG LOCKING 3.2X42 (Screw) ×2 IMPLANT
PLATE HUMERUS LP PROX L 3H (Plate) ×2 IMPLANT
SCREW CORTICAL LOW PROF 3.5X32 (Screw) ×2 IMPLANT
SCREW LOCK CORT STAR 3.5X28 (Screw) ×4 IMPLANT
SCREW LOW PROF TIS 3.5X28MM (Screw) ×2 IMPLANT
SCREW T15 MD 3.5X36MM NS (Screw) ×2 IMPLANT
SCREW T15 MD 3.5X40MM NS (Screw) ×2 IMPLANT
SLEEVE MEASURING 3.2 (BIT) ×2 IMPLANT
SLING ARM FOAM STRAP MED (SOFTGOODS) ×2 IMPLANT
SPONGE LAP 4X18 X RAY DECT (DISPOSABLE) IMPLANT
STAPLER VISISTAT 35W (STAPLE) IMPLANT
STRIP CLOSURE SKIN 1/2X4 (GAUZE/BANDAGES/DRESSINGS) ×2 IMPLANT
SUCTION FRAZIER HANDLE 10FR (MISCELLANEOUS) ×1
SUCTION TUBE FRAZIER 10FR DISP (MISCELLANEOUS) ×1 IMPLANT
SUPPORT WRAP ARM LG (MISCELLANEOUS) ×2 IMPLANT
SUT FIBERWIRE #2 38 T-5 BLUE (SUTURE)
SUT MAXBRAID (SUTURE) ×2 IMPLANT
SUT MNCRL AB 3-0 PS2 18 (SUTURE) ×2 IMPLANT
SUT MNCRL AB 4-0 PS2 18 (SUTURE) ×2 IMPLANT
SUT MON AB 2-0 CT1 27 (SUTURE) IMPLANT
SUT MON AB 2-0 CT1 36 (SUTURE) ×2 IMPLANT
SUT PDS AB 1 CT  36 (SUTURE) ×1
SUT PDS AB 1 CT 36 (SUTURE) ×1 IMPLANT
SUT VIC AB 0 CTB1 27 (SUTURE) IMPLANT
SUTURE FIBERWR #2 38 T-5 BLUE (SUTURE) IMPLANT
SYR BULB IRRIGATION 50ML (SYRINGE) ×2 IMPLANT
SYR CONTROL 10ML LL (SYRINGE) ×2 IMPLANT
TOWEL OR 17X24 6PK STRL BLUE (TOWEL DISPOSABLE) ×2 IMPLANT
TOWEL OR 17X26 10 PK STRL BLUE (TOWEL DISPOSABLE) ×2 IMPLANT
TOWEL OR NON WOVEN STRL DISP B (DISPOSABLE) IMPLANT
WATER STERILE IRR 1000ML POUR (IV SOLUTION) ×2 IMPLANT

## 2016-08-19 NOTE — Anesthesia Procedure Notes (Signed)
Procedure Name: Intubation Date/Time: 08/19/2016 12:57 PM Performed by: Lanell MatarBAKER, Kaysi Ourada M Pre-anesthesia Checklist: Patient identified, Emergency Drugs available, Suction available and Patient being monitored Patient Re-evaluated:Patient Re-evaluated prior to inductionOxygen Delivery Method: Circle System Utilized Preoxygenation: Pre-oxygenation with 100% oxygen Intubation Type: IV induction Ventilation: Mask ventilation without difficulty Laryngoscope Size: Miller and 2 Grade View: Grade I Tube type: Oral Tube size: 7.0 mm Number of attempts: 1 Airway Equipment and Method: Stylet and Oral airway Placement Confirmation: ETT inserted through vocal cords under direct vision,  positive ETCO2 and breath sounds checked- equal and bilateral Secured at: 22 cm Tube secured with: Tape Dental Injury: Teeth and Oropharynx as per pre-operative assessment

## 2016-08-19 NOTE — Anesthesia Preprocedure Evaluation (Signed)
Anesthesia Evaluation  Patient identified by MRN, date of birth, ID band Patient awake    Reviewed: Allergy & Precautions, NPO status , Patient's Chart, lab work & pertinent test results  Airway Mallampati: II  TM Distance: >3 FB Neck ROM: Full    Dental  (+) Teeth Intact, Dental Advisory Given   Pulmonary    breath sounds clear to auscultation       Cardiovascular  Rhythm:Regular Rate:Normal     Neuro/Psych    GI/Hepatic   Endo/Other    Renal/GU      Musculoskeletal   Abdominal   Peds  Hematology   Anesthesia Other Findings   Reproductive/Obstetrics                             Anesthesia Physical Anesthesia Plan  ASA: II  Anesthesia Plan: General and Regional   Post-op Pain Management:    Induction: Intravenous  Airway Management Planned: Oral ETT  Additional Equipment:   Intra-op Plan:   Post-operative Plan: Extubation in OR  Informed Consent: I have reviewed the patients History and Physical, chart, labs and discussed the procedure including the risks, benefits and alternatives for the proposed anesthesia with the patient or authorized representative who has indicated his/her understanding and acceptance.   Dental advisory given  Plan Discussed with: CRNA and Anesthesiologist  Anesthesia Plan Comments:         Anesthesia Quick Evaluation  

## 2016-08-19 NOTE — Transfer of Care (Signed)
Immediate Anesthesia Transfer of Care Note  Patient: Martha Caldwell  Procedure(s) Performed: Procedure(s): ORIF left proximal humerus fracture (Left)  Patient Location: PACU  Anesthesia Type:General  Level of Consciousness: awake  Airway & Oxygen Therapy: Patient Spontanous Breathing  Post-op Assessment: Report given to RN and Post -op Vital signs reviewed and stable  Post vital signs: Reviewed and stable  Last Vitals:  Vitals:   08/19/16 1035 08/19/16 1548  BP:  119/79  Pulse: 69 76  Resp: 18 (!) 22  Temp:  36.1 C    Last Pain:  Vitals:   08/19/16 1548  TempSrc:   PainSc: (P) 0-No pain         Complications: No apparent anesthesia complications

## 2016-08-19 NOTE — Op Note (Addendum)
08/19/2016  4:03 PM  PATIENT:  Weston Brass    PRE-OPERATIVE DIAGNOSIS:  Left proximal humerus fracture  POST-OPERATIVE DIAGNOSIS:  Same  PROCEDURE:  1. ORIF left proximal humerus fracture 2. Tenodesis long head of biceps tendon  SURGEON:  Yolonda Kida, MD  ASSISTANT: Patrick Jupiter, RNFA  ANESTHESIA:   General  PREOPERATIVE INDICATIONS:  LYNZY RAWLES is a  51 y.o. female with a diagnosis of Left proximal humerus fracture who elected for surgical management.  She did have fairly considerable displacement when I evaluated her in clinic and the concern was that that would not be able to go on to heal nonoperatively.  The risks benefits and alternatives were discussed with the patient including but not limited to the risks of nonoperative treatment, versus surgical intervention including infection, bleeding, nerve injury, malunion, nonunion, the need for revision surgery, hardware prominence, hardware failure, the need for hardware removal, blood clots, cardiopulmonary complications, conversion to arthroplasty, morbidity, mortality, among others, and they were willing to proceed.  Predicted outcome is good, although there will be at least a six to nine month expected recovery.   OPERATIVE IMPLANTS: Biomet ALPS proximal humerus locking plate.  OPERATIVE FINDINGS: Displaced proximal humerus fracture, with lateral bone loss and extended posteriorly at the head neck junction. 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   OPERATIVE PROCEDURE: The patient was brought to the operating room and placed in the supine position. General anesthesia was administered. IV antibiotics were given. She was placed in the beach chair position. All bony prominences were padded. The upper extremity was prepped and draped in usual sterile fashion. Deltopectoral incision was performed.  I exposed the fracture site,  and placed deep retractors. The biceps tendon was 90% torn at the fracture site due to the jagged edge of the distal piece. I then elected to perform tenodesis of the long head tendon to the pectoralis major tendon. I elevated a small portion of the deltoid off of the shaft, in order to gain access for the plate, and then reduced the head onto the shaft. This was maintained in satisfactory position.  We did note at this juncture however that there was some posterior lateral bone loss at the head neck junction allowing for posterior roll off. This was able to be reduced with traction and a Cobb elevator however the bone LOSS was very obvious.  I applied the plate and secured it into the sliding hole first. I confirmed position of the reduction and the plate with C-arm, and I placed a total of 2 guidewires into the appropriate position in the head. I was satisfied that the plate was distal appropriately, and then secured the plate proximally with smooth pegs, taking care to prevent penetration into the arch articular surface, using C-arm, as well as manual feel using a hand drill.  I then secured the plate distally using cortical screws. Due to the bone void we elected to proceed with bone void filler with calcium phosphate into the cavernous lesion of the proximal head neck junction. This was allowed to harden for 10 minutes. Once complete fixation and reduction had been achieved, I took final C-arm pictures, and irrigated the wounds copiously, and repaired the deltopectoral interval with Vicryl followed by Vicryl for the subcutaneous tissue with Monocryl and Steri-Strips for the skin. She was placed in a sling. She had a preoperative regional block as well. She tolerated the  procedure well with no complications.  Disposition:  Weston BrassMelissa D Douds will remain nonweightbearing on the left upper extremity. She will begin passive range of motion exercises at the shoulder approximately 2 weeks from surgery. All  allow her to immediately began gentle elbow hand and wrist range of motion as tolerated. She'll return to see me in clinic in 2 weeks for a wound check. We will get the first sets of x-rays 6 week postoperative mark.  Yolonda KidaJason Patrick Rogers, MD [facility]
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
## 2016-08-19 NOTE — Discharge Instructions (Signed)
-  Nonweightbearing to the left upper extremity. -Sling to be worn unless performing hand wrist and/or elbow range of motion. -Apply ice to the left shoulder for 20 minutes out of every hour while awake. -For pain take Tylenol every 6 hours around the clock and oxycodone as needed for breakthrough pain. -For nausea or vomiting take Zofran as directed. -Maintain her postoperative dressing in place until your 2 week postop appointment. It is okay to shower without in place but do not submerge under water.

## 2016-08-19 NOTE — Anesthesia Postprocedure Evaluation (Addendum)
Anesthesia Post Note  Patient: Martha Caldwell  Procedure(s) Performed: Procedure(s) (LRB): ORIF left proximal humerus fracture (Left)  Patient location during evaluation: PACU Anesthesia Type: General and Regional Level of consciousness: awake, awake and alert and oriented Pain management: pain level controlled Vital Signs Assessment: post-procedure vital signs reviewed and stable Respiratory status: spontaneous breathing, nonlabored ventilation and respiratory function stable Cardiovascular status: blood pressure returned to baseline Anesthetic complications: no       Last Vitals:  Vitals:   08/19/16 1615 08/19/16 1630  BP: 102/73 117/81  Pulse: 64 77  Resp: 10 14  Temp:      Last Pain:  Vitals:   08/19/16 1615  TempSrc:   PainSc: 0-No pain                 Vicki Pasqual COKER

## 2016-08-20 ENCOUNTER — Encounter (HOSPITAL_COMMUNITY): Payer: Self-pay | Admitting: Orthopedic Surgery

## 2016-08-20 NOTE — Anesthesia Procedure Notes (Signed)
Anesthesia Regional Block: Interscalene brachial plexus block   Pre-Anesthetic Checklist: ,, timeout performed, Correct Patient, Correct Site, Correct Laterality, Correct Procedure, Correct Position, site marked, Risks and benefits discussed,  Surgical consent,  Pre-op evaluation,  At surgeon's request and post-op pain management  Laterality: Left  Prep: chloraprep       Needles:  Injection technique: Single-shot  Needle Type: Stimulator Needle - 80          Additional Needles:   Procedures: ultrasound guided,,,,,,,,  Narrative:  Start time: 08/19/2016 1:30 PM End time: 08/19/2016 1:35 PM Injection made incrementally with aspirations every 5 mL.  Performed by: Personally  Anesthesiologist: Dierdre Mccalip  Additional Notes: 20 cc 0.75% Ropivacaine injected easily

## 2016-08-20 NOTE — Addendum Note (Signed)
Addendum  created 08/20/16 2027 by Kipp Broodavid Gurtaj Ruz, MD   Anesthesia Intra Blocks edited, Child order released for a procedure order, Sign clinical note

## 2016-11-12 ENCOUNTER — Telehealth: Payer: Self-pay

## 2016-11-12 NOTE — Telephone Encounter (Signed)
Called today stating that she needs a letter stating that she does indeed have a TBI for insurance purposes, patient has not been seen in the clinic in 2 years. Please advise

## 2016-11-13 NOTE — Telephone Encounter (Signed)
She would need to see me for letter. Perhaps she could contact her primary or someone who has seen her in the past few months.

## 2016-11-13 NOTE — Telephone Encounter (Signed)
Called patient back and relayed message, states she will make an appointment to see Dr. Riley KillSwartz,

## 2016-12-25 ENCOUNTER — Encounter: Payer: Self-pay | Admitting: Physical Medicine & Rehabilitation

## 2016-12-25 ENCOUNTER — Encounter
Payer: BLUE CROSS/BLUE SHIELD | Attending: Physical Medicine & Rehabilitation | Admitting: Physical Medicine & Rehabilitation

## 2016-12-25 VITALS — BP 119/79 | HR 68

## 2016-12-25 DIAGNOSIS — S069X0A Unspecified intracranial injury without loss of consciousness, initial encounter: Secondary | ICD-10-CM | POA: Diagnosis not present

## 2016-12-25 DIAGNOSIS — S42302A Unspecified fracture of shaft of humerus, left arm, initial encounter for closed fracture: Secondary | ICD-10-CM | POA: Diagnosis not present

## 2016-12-25 DIAGNOSIS — X58XXXA Exposure to other specified factors, initial encounter: Secondary | ICD-10-CM | POA: Diagnosis not present

## 2016-12-25 DIAGNOSIS — G8114 Spastic hemiplegia affecting left nondominant side: Secondary | ICD-10-CM | POA: Insufficient documentation

## 2016-12-25 DIAGNOSIS — M2042 Other hammer toe(s) (acquired), left foot: Secondary | ICD-10-CM | POA: Diagnosis not present

## 2016-12-25 DIAGNOSIS — Z87820 Personal history of traumatic brain injury: Secondary | ICD-10-CM

## 2016-12-25 DIAGNOSIS — R482 Apraxia: Secondary | ICD-10-CM | POA: Diagnosis not present
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
## 2016-12-25 NOTE — Patient Instructions (Signed)
PLEASE FEEL FREE TO CALL OUR OFFICE WITH ANY PROBLEMS OR QUESTIONS ([phone])      

## 2017-01-29 ENCOUNTER — Encounter: Payer: Self-pay | Admitting: Physical Medicine & Rehabilitation

## 2017-01-29 ENCOUNTER — Encounter
Payer: BLUE CROSS/BLUE SHIELD | Attending: Physical Medicine & Rehabilitation | Admitting: Physical Medicine & Rehabilitation

## 2017-01-29 VITALS — BP 113/76 | HR 66

## 2017-01-29 DIAGNOSIS — G8114 Spastic hemiplegia affecting left nondominant side: Secondary | ICD-10-CM | POA: Diagnosis not present

## 2017-01-29 DIAGNOSIS — S42302A Unspecified fracture of shaft of humerus, left arm, initial encounter for closed fracture: Secondary | ICD-10-CM | POA: Insufficient documentation

## 2017-01-29 DIAGNOSIS — M2042 Other hammer toe(s) (acquired), left foot: Secondary | ICD-10-CM | POA: Diagnosis not present

## 2017-01-29 DIAGNOSIS — S069X0A Unspecified intracranial injury without loss of consciousness, initial encounter: Secondary | ICD-10-CM | POA: Insufficient documentation

## 2017-01-29 NOTE — Progress Notes (Signed)
Botox Injection for spasticity using needle EMG guidance Indication: Left spastic hemiparesis (HCC)   Dilution: 100 Units/ml        Total Units Injected: 100 Indication: Severe spasticity which interferes with ADL,mobility and/or  hygiene and is unresponsive to medication management and other conservative care Informed consent was obtained after describing risks and benefits of the procedure with the patient. This includes bleeding, bruising, infection, excessive weakness, or medication side effects. A REMS form is on file and signed.  Needle: 50mm injectable monopolar needle electrode  Number of units per muscle  Quadriceps 0 units Gastroc/soleus 0 units Hamstrings 0 units Tibialis Posterior 0 units EHL 0 units FDL 50 units 2 access points FHL 50 units 2 access points All injections were done after obtaining appropriate EMG activity and after negative drawback for blood. The patient tolerated the procedure well. Post procedure instructions were given. Return in about 2 months (around 03/31/2017).   Ordered a functional capacity evaluation for Mrs. Denton LankGriffith as she is being asked for a functional capacity assessment.

## 2017-01-29 NOTE — Patient Instructions (Signed)
WORK ON STRETCHING YOUR FOOT WHEN YOU CAN

## 2017-02-06 NOTE — Addendum Note (Signed)
Addendum  created 02/06/17 1124 by Kipp Brood, MD   Sign clinical note

## 2017-02-26 ENCOUNTER — Ambulatory Visit: Payer: BLUE CROSS/BLUE SHIELD

## 2017-03-31 ENCOUNTER — Encounter
Payer: BLUE CROSS/BLUE SHIELD | Attending: Physical Medicine & Rehabilitation | Admitting: Physical Medicine & Rehabilitation

## 2017-03-31 ENCOUNTER — Encounter: Payer: Self-pay | Admitting: Physical Medicine & Rehabilitation

## 2017-03-31 VITALS — BP 122/86 | HR 64

## 2017-03-31 DIAGNOSIS — S069X0A Unspecified intracranial injury without loss of consciousness, initial encounter: Secondary | ICD-10-CM | POA: Insufficient documentation

## 2017-03-31 DIAGNOSIS — S42302A Unspecified fracture of shaft of humerus, left arm, initial encounter for closed fracture: Secondary | ICD-10-CM | POA: Insufficient documentation

## 2017-03-31 DIAGNOSIS — G8114 Spastic hemiplegia affecting left nondominant side: Secondary | ICD-10-CM

## 2017-03-31 DIAGNOSIS — M2042 Other hammer toe(s) (acquired), left foot: Secondary | ICD-10-CM | POA: Insufficient documentation

## 2017-03-31 NOTE — Patient Instructions (Signed)
Contact [employer] about your Associate Professor.  [phone]
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
## 2017-04-09 ENCOUNTER — Ambulatory Visit: Payer: BLUE CROSS/BLUE SHIELD | Attending: Physical Medicine & Rehabilitation

## 2017-04-09 DIAGNOSIS — R262 Difficulty in walking, not elsewhere classified: Secondary | ICD-10-CM

## 2017-04-09 DIAGNOSIS — M62838 Other muscle spasm: Secondary | ICD-10-CM | POA: Diagnosis present

## 2017-04-09 DIAGNOSIS — M6281 Muscle weakness (generalized): Secondary | ICD-10-CM | POA: Diagnosis present
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
## 2017-04-17 DIAGNOSIS — R87610 Atypical squamous cells of undetermined significance on cytologic smear of cervix (ASC-US): Secondary | ICD-10-CM

## 2017-04-17 HISTORY — DX: Atypical squamous cells of undetermined significance on cytologic smear of cervix (ASC-US): R87.610

## 2017-04-30 ENCOUNTER — Encounter: Payer: Self-pay | Admitting: Gynecology

## 2017-04-30 ENCOUNTER — Ambulatory Visit (INDEPENDENT_AMBULATORY_CARE_PROVIDER_SITE_OTHER): Payer: BLUE CROSS/BLUE SHIELD | Admitting: Gynecology

## 2017-04-30 VITALS — BP 120/76 | Ht 65.0 in | Wt 129.0 lb

## 2017-04-30 DIAGNOSIS — Z30431 Encounter for routine checking of intrauterine contraceptive device: Secondary | ICD-10-CM

## 2017-04-30 DIAGNOSIS — Z01419 Encounter for gynecological examination (general) (routine) without abnormal findings: Secondary | ICD-10-CM | POA: Diagnosis not present

## 2017-04-30 LAB — CBC WITH DIFFERENTIAL/PLATELET
BASOS ABS: 19 {cells}/uL (ref 0–200)
Basophils Relative: 0.5 %
EOS PCT: 2.1 %
Eosinophils Absolute: 80 cells/uL (ref 15–500)
HCT: 38.8 % (ref 35.0–45.0)
HEMOGLOBIN: 13.1 g/dL (ref 11.7–15.5)
Lymphs Abs: 1417 cells/uL (ref 850–3900)
MCH: 31.9 pg (ref 27.0–33.0)
MCHC: 33.8 g/dL (ref 32.0–36.0)
MCV: 94.4 fL (ref 80.0–100.0)
MONOS PCT: 8.2 %
MPV: 10.9 fL (ref 7.5–12.5)
NEUTROS ABS: 1972 {cells}/uL (ref 1500–7800)
NEUTROS PCT: 51.9 %
Platelets: 192 10*3/uL (ref 140–400)
RBC: 4.11 10*6/uL (ref 3.80–5.10)
RDW: 11.7 % (ref 11.0–15.0)
Total Lymphocyte: 37.3 %
WBC mixed population: 312 cells/uL (ref 200–950)
WBC: 3.8 10*3/uL (ref 3.8–10.8)

## 2017-04-30 LAB — COMPREHENSIVE METABOLIC PANEL
AG Ratio: 2 (calc) (ref 1.0–2.5)
ALT: 10 U/L (ref 6–29)
AST: 17 U/L (ref 10–35)
Albumin: 4.3 g/dL (ref 3.6–5.1)
Alkaline phosphatase (APISO): 38 U/L (ref 33–130)
BUN: 12 mg/dL (ref 7–25)
CO2: 29 mmol/L (ref 20–32)
Calcium: 9.6 mg/dL (ref 8.6–10.4)
Chloride: 106 mmol/L (ref 98–110)
Creat: 0.65 mg/dL (ref 0.50–1.05)
Globulin: 2.1 g/dL (calc) (ref 1.9–3.7)
Glucose, Bld: 60 mg/dL — ABNORMAL LOW (ref 65–99)
Potassium: 4.1 mmol/L (ref 3.5–5.3)
SODIUM: 142 mmol/L (ref 135–146)
TOTAL PROTEIN: 6.4 g/dL (ref 6.1–8.1)
Total Bilirubin: 1 mg/dL (ref 0.2–1.2)

## 2017-04-30 NOTE — Patient Instructions (Signed)
Schedule your colonoscopy with either:  [facility]   Address: [address], [place], [place] [address]  [phone]    or  [facility]  Address: [address], [place], [place] [address]  [phone]      

## 2017-04-30 NOTE — Addendum Note (Signed)
Addended by: Dayna BarkerGARDNER, KIMBERLY K on: 04/30/2017 10:29 AM   Modules accepted: Orders

## 2017-04-30 NOTE — Progress Notes (Signed)
    Martha Caldwell [birth year] [id number]        51 y.o.  G2P2 for annual gynecologic exam.  Doing well without complaints.  Past medical history,surgical history, problem list, medications, allergies, family history and social history were all reviewed and documented as reviewed in the EPIC chart.  ROS:  Performed with pertinent positives and negatives included in the history, assessment and plan.   Additional significant findings : None   Exam: Kennon PortelaKim Caldwell assistant Vitals:   04/30/17 0922  BP: 120/76  Weight: 129 lb (58.5 kg)  Height: 5\' 5"  (1.651 m)   Body mass index is 21.47 kg/m.  General appearance:  Normal affect, orientation and appearance. Skin: Grossly normal HEENT: Without gross lesions.  No cervical or supraclavicular adenopathy. Thyroid normal.  Lungs:  Clear without wheezing, rales or rhonchi Cardiac: RR, without RMG Abdominal:  Soft, nontender, without masses, guarding, rebound, organomegaly or hernia Breasts:  Examined lying and sitting without masses, retractions, discharge or axillary adenopathy.  Bilateral implants noted Pelvic:  Ext, BUS, Vagina: Normal  Cervix: Normal.  Pap smear done.  IUD string visualized  Uterus: Anteverted, normal size, shape and contour, midline and mobile nontender   Adnexa: Without masses or tenderness    Anus and perineum: Normal   Rectovaginal: Normal sphincter tone without palpated masses or tenderness.    Assessment/Plan:  51 y.o. G2P2 female for annual gynecologic exam without menses, Mirena IUD.   1. Mirena IUD 07/2013.  Doing well without bleeding.  IUD string visualized. 2. Mammography 04/2017.  Continue with annual mammography when due.  Breast exam normal today. 3. Pap smear/HPV 04/2015 negative.  History of ASCUS positive high risk HPV 2015.  Colposcopy adequate normal with negative ECC.  Pap smear done today. 4. Health maintenance.  Baseline CBC, CMP ordered.  Lipid profile last year normal with cholesterol 132  LDL 75 and not repeated.  Follow-up in 1 year, sooner as needed.   Dara LordsFONTAINE,TIMOTHY P MD, 9:42 AM 04/30/2017

## 2017-05-05 ENCOUNTER — Encounter: Payer: Self-pay | Admitting: Physical Medicine & Rehabilitation

## 2017-05-05 ENCOUNTER — Encounter
Payer: BLUE CROSS/BLUE SHIELD | Attending: Physical Medicine & Rehabilitation | Admitting: Physical Medicine & Rehabilitation

## 2017-05-05 ENCOUNTER — Other Ambulatory Visit: Payer: Self-pay

## 2017-05-05 VITALS — BP 115/82 | HR 70 | Resp 14

## 2017-05-05 DIAGNOSIS — G8114 Spastic hemiplegia affecting left nondominant side: Secondary | ICD-10-CM | POA: Insufficient documentation

## 2017-05-05 DIAGNOSIS — S069X0A Unspecified intracranial injury without loss of consciousness, initial encounter: Secondary | ICD-10-CM | POA: Diagnosis not present

## 2017-05-05 DIAGNOSIS — S42302A Unspecified fracture of shaft of humerus, left arm, initial encounter for closed fracture: Secondary | ICD-10-CM | POA: Insufficient documentation

## 2017-05-05 DIAGNOSIS — M2042 Other hammer toe(s) (acquired), left foot: Secondary | ICD-10-CM | POA: Diagnosis not present

## 2017-05-05 NOTE — Patient Instructions (Signed)
PLEASE FEEL FREE TO CALL OUR OFFICE WITH ANY PROBLEMS OR QUESTIONS ([phone])      

## 2017-05-05 NOTE — Progress Notes (Signed)
Botox Injection for spasticity using needle EMG guidance Indication: Left spastic hemiparesis (HCC)   Dilution: 100 Units/ml        Total Units Injected: 200 Indication: Severe spasticity which interferes with ADL,mobility and/or  hygiene and is unresponsive to medication management and other conservative care Informed consent was obtained after describing risks and benefits of the procedure with the patient. This includes bleeding, bruising, infection, excessive weakness, or medication side effects. A REMS form is on file and signed.  Needle: 50mm injectable monopolar needle electrode  Number of units per muscle  Quadriceps 0 units Gastroc/soleus 0 units Hamstrings 0 units Tibialis Posterior 0 units EHL 100 units, 2 access points EDL 100 units, 3 access points  All injections were done after obtaining appropriate EMG activity and after negative drawback for blood. The patient tolerated the procedure well. Post procedure instructions were given. Return in about 3 months (around 08/05/2017).

## 2017-05-07 ENCOUNTER — Encounter: Payer: Self-pay | Admitting: Gynecology

## 2017-05-07 ENCOUNTER — Ambulatory Visit: Payer: BLUE CROSS/BLUE SHIELD | Admitting: Physical Medicine & Rehabilitation

## 2017-05-07 LAB — PAP IG W/ RFLX HPV ASCU

## 2017-05-07 LAB — HUMAN PAPILLOMAVIRUS, HIGH RISK: HPV DNA HIGH RISK: NOT DETECTED

## 2017-08-05 ENCOUNTER — Encounter
Payer: BLUE CROSS/BLUE SHIELD | Attending: Physical Medicine & Rehabilitation | Admitting: Physical Medicine & Rehabilitation

## 2017-08-05 ENCOUNTER — Encounter: Payer: Self-pay | Admitting: Physical Medicine & Rehabilitation

## 2017-08-05 VITALS — BP 117/78 | HR 69

## 2017-08-05 DIAGNOSIS — Z8781 Personal history of (healed) traumatic fracture: Secondary | ICD-10-CM | POA: Diagnosis not present

## 2017-08-05 DIAGNOSIS — M2042 Other hammer toe(s) (acquired), left foot: Secondary | ICD-10-CM | POA: Insufficient documentation

## 2017-08-05 DIAGNOSIS — Z87820 Personal history of traumatic brain injury: Secondary | ICD-10-CM

## 2017-08-05 DIAGNOSIS — X58XXXS Exposure to other specified factors, sequela: Secondary | ICD-10-CM | POA: Diagnosis not present

## 2017-08-05 DIAGNOSIS — G8114 Spastic hemiplegia affecting left nondominant side: Secondary | ICD-10-CM | POA: Insufficient documentation

## 2017-08-05 DIAGNOSIS — S06890S Other specified intracranial injury without loss of consciousness, sequela: Secondary | ICD-10-CM | POA: Insufficient documentation
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
## 2017-08-05 NOTE — Patient Instructions (Signed)
PLEASE FEEL FREE TO CALL OUR OFFICE WITH ANY PROBLEMS OR QUESTIONS ([phone])      

## 2017-09-09 ENCOUNTER — Ambulatory Visit: Payer: BLUE CROSS/BLUE SHIELD | Admitting: Family Medicine

## 2017-09-09 ENCOUNTER — Encounter
Payer: BLUE CROSS/BLUE SHIELD | Attending: Physical Medicine & Rehabilitation | Admitting: Physical Medicine & Rehabilitation

## 2017-09-09 ENCOUNTER — Encounter: Payer: Self-pay | Admitting: Physical Medicine & Rehabilitation

## 2017-09-09 VITALS — BP 111/77 | HR 62 | Resp 14

## 2017-09-09 DIAGNOSIS — Z8781 Personal history of (healed) traumatic fracture: Secondary | ICD-10-CM | POA: Diagnosis not present

## 2017-09-09 DIAGNOSIS — G8114 Spastic hemiplegia affecting left nondominant side: Secondary | ICD-10-CM

## 2017-09-09 DIAGNOSIS — S06890S Other specified intracranial injury without loss of consciousness, sequela: Secondary | ICD-10-CM | POA: Diagnosis present

## 2017-09-09 DIAGNOSIS — M2042 Other hammer toe(s) (acquired), left foot: Secondary | ICD-10-CM | POA: Diagnosis not present

## 2017-09-09 NOTE — Progress Notes (Signed)
Botox Injection for spasticity using needle EMG guidance Indication: Left spastic hemiparesis (HCC)   Dilution: 100 Units/ml        Total Units Injected: 200 Indication: Severe spasticity which interferes with ADL,mobility and/or  hygiene and is unresponsive to medication management and other conservative care Informed consent was obtained after describing risks and benefits of the procedure with the patient. This includes bleeding, bruising, infection, excessive weakness, or medication side effects. A REMS form is on file and signed.  Needle: 50mm injectable monopolar needle electrode  Number of units per muscle  LLE Quadriceps 0 units Gastroc/soleus 0 units Hamstrings 0 units Tibialis Posterior 0 units EHL 0 units Flexor Hallucis Longus 100 units, 2 access points Flexor Digitorum Longus 100 units, 2 access points  All injections were done after obtaining appropriate EMG activity and after negative drawback for blood. The patient tolerated the procedure well. Post procedure instructions were given. Return in about 3 months (around 12/10/2017).

## 2017-09-09 NOTE — Patient Instructions (Signed)
PLEASE FEEL FREE TO CALL OUR OFFICE WITH ANY PROBLEMS OR QUESTIONS ([phone])      

## 2017-12-10 ENCOUNTER — Encounter: Payer: BLUE CROSS/BLUE SHIELD | Admitting: Physical Medicine & Rehabilitation

## 2018-02-12 ENCOUNTER — Telehealth: Payer: Self-pay | Admitting: Physical Medicine & Rehabilitation

## 2018-02-12 DIAGNOSIS — S069X0S Unspecified intracranial injury without loss of consciousness, sequela: Principal | ICD-10-CM

## 2018-02-12 DIAGNOSIS — F068 Other specified mental disorders due to known physiological condition: Secondary | ICD-10-CM

## 2018-02-12 NOTE — Telephone Encounter (Signed)
Recd letter of complaint regarding botox charges - patient husband feels MD at fault for not explaining cost.  I research all charges and documented billing. Originial charge was Preauth'd and paid for by [employer]. Other two charges went toward high deductibles. Complaint originally went to April she explained billing to husband.  I returned complaint call - Timeline [facility] and Patient Experience.  I contacted Patient accounting to refund [employer] for sample charges and called ptn to advise I am reaching out to [employer] to see if we can get discount program.  Notifying my Director of update

## 2018-02-12 NOTE — Telephone Encounter (Signed)
After phone call with Kerry DoryMellissa Staup's husband today(02/12/18), he asked me to be on the look out for you to put in a referral for her to see Dr Kieth Brightlyodenbough.  This is just a reminder for you to put the referral in Epic...  Thanks, Rosezella FloridaLisa M

## 2018-02-13 NOTE — Telephone Encounter (Signed)
done

## 2018-02-18 ENCOUNTER — Telehealth: Payer: Self-pay | Admitting: Physical Medicine & Rehabilitation

## 2018-02-18 NOTE — Telephone Encounter (Signed)
Got information to Mr. Durand to contact [employer] for attempt at retro discount program - emailed.

## 2018-04-17 ENCOUNTER — Encounter

## 2018-04-17 ENCOUNTER — Encounter: Payer: BLUE CROSS/BLUE SHIELD | Attending: Psychology | Admitting: Psychology

## 2018-04-17 DIAGNOSIS — S069X0S Unspecified intracranial injury without loss of consciousness, sequela: Secondary | ICD-10-CM | POA: Diagnosis not present

## 2018-04-17 DIAGNOSIS — G8114 Spastic hemiplegia affecting left nondominant side: Secondary | ICD-10-CM | POA: Insufficient documentation

## 2018-04-17 DIAGNOSIS — F068 Other specified mental disorders due to known physiological condition: Secondary | ICD-10-CM

## 2018-04-17 DIAGNOSIS — Z833 Family history of diabetes mellitus: Secondary | ICD-10-CM | POA: Diagnosis not present

## 2018-04-17 DIAGNOSIS — Z87820 Personal history of traumatic brain injury: Secondary | ICD-10-CM | POA: Diagnosis present

## 2018-05-06 ENCOUNTER — Encounter: Payer: BLUE CROSS/BLUE SHIELD | Admitting: Gynecology

## 2018-05-07 ENCOUNTER — Encounter: Payer: Self-pay | Admitting: Gynecology

## 2018-05-07 ENCOUNTER — Ambulatory Visit (INDEPENDENT_AMBULATORY_CARE_PROVIDER_SITE_OTHER): Payer: BLUE CROSS/BLUE SHIELD | Admitting: Gynecology

## 2018-05-07 VITALS — BP 118/76 | Ht 65.5 in | Wt 122.0 lb

## 2018-05-07 DIAGNOSIS — Z13220 Encounter for screening for lipoid disorders: Secondary | ICD-10-CM | POA: Diagnosis not present

## 2018-05-07 DIAGNOSIS — Z30431 Encounter for routine checking of intrauterine contraceptive device: Secondary | ICD-10-CM

## 2018-05-07 DIAGNOSIS — N951 Menopausal and female climacteric states: Secondary | ICD-10-CM

## 2018-05-07 DIAGNOSIS — Z01419 Encounter for gynecological examination (general) (routine) without abnormal findings: Secondary | ICD-10-CM | POA: Diagnosis not present

## 2018-05-07 DIAGNOSIS — N912 Amenorrhea, unspecified: Secondary | ICD-10-CM

## 2018-05-07 NOTE — Addendum Note (Signed)
Addended by: Dayna BarkerGARDNER, Rahman Ferrall K on: 05/07/2018 12:48 PM   Modules accepted: Orders

## 2018-05-07 NOTE — Patient Instructions (Signed)
Office will call you with blood test results and we will decide about the IUD.  Follow-up in 1 year for annual exam.

## 2018-05-07 NOTE — Progress Notes (Signed)
    Martha Caldwell 1965-06-22 [id number]        52 y.o.  G2P2 for annual gynecologic exam.  Continues without menses with Mirena IUD.  Due to be replaced this coming February.  Has some temperature instability but no classic hot flushes or sweats.  Also due to have her Pap smear repeated with Pap smear last year showing ASCUS negative high risk HPV.  Pap smear 2015 showed ASCUS positive high risk HPV with colposcopy normal and negative ECC.  Follow-up Pap smear 2016 normal with negative HPV.  Past medical history,surgical history, problem list, medications, allergies, family history and social history were all reviewed and documented as reviewed in the EPIC chart.  ROS:  Performed with pertinent positives and negatives included in the history, assessment and plan.   Additional significant findings : None   Exam: Kennon PortelaKim Gardner assistant Vitals:   05/07/18 1157  BP: 118/76  Weight: 122 lb (55.3 kg)  Height: 5' 5.5" (1.664 m)   Body mass index is 19.99 kg/m.  General appearance:  Normal affect, orientation and appearance. Skin: Grossly normal HEENT: Without gross lesions.  No cervical or supraclavicular adenopathy. Thyroid normal.  Lungs:  Clear without wheezing, rales or rhonchi Cardiac: RR, without RMG Abdominal:  Soft, nontender, without masses, guarding, rebound, organomegaly or hernia Breasts:  Examined lying and sitting without masses, retractions, discharge or axillary adenopathy.  Bilateral implants noted Pelvic:  Ext, BUS, Vagina: Small classic skin tag lower left labia majora, present for years  Cervix: Normal.  IUD string visualized.  Pap smear done.  Uterus: Anteverted, normal size, shape and contour, midline and mobile nontender   Adnexa: Without masses or tenderness    Anus and perineum: Normal   Rectovaginal: Normal sphincter tone without palpated masses or tenderness.    Assessment/Plan:  52 y.o. G2P2 female for annual gynecologic exam without menses, Mirena IUD.     1. Mirena IUD 07/2013.  Doing well without menses.  Due to be replaced this coming February having some temperature instability but no classic hot flushes.  Will check baseline TSH FSH.  We will follow-up for results and then we will formulate a game plan as far as her IUD. 2. Pap smear ASCUS negative high risk HPV 2018.  ASCUS positive high risk HPV 2015.  Pap smear 2016 normal with negative HPV.  Follow-up Pap smear done today.  Will triaged based upon results. 3. Breast exam normal.  Mammography 04/2018 normal.  Continue with annual mammography when due. 4. Colonoscopy 2019.  Repeat at their recommended interval. 5. Health maintenance.  Baseline CBC, CMP, lipid profile, TSH and FSH ordered.  Follow-up for lab results.  Follow-up in 1 year for annual exam.   Dara Lordsimothy P Anthoney Sheppard MD, 12:27 PM 05/07/2018

## 2018-05-08 ENCOUNTER — Encounter: Payer: Self-pay | Admitting: Gynecology

## 2018-05-08 LAB — CBC WITH DIFFERENTIAL/PLATELET
BASOS ABS: 19 {cells}/uL (ref 0–200)
BASOS PCT: 0.4 %
EOS ABS: 82 {cells}/uL (ref 15–500)
Eosinophils Relative: 1.7 %
HCT: 39 % (ref 35.0–45.0)
HEMOGLOBIN: 13.4 g/dL (ref 11.7–15.5)
Lymphs Abs: 1642 cells/uL (ref 850–3900)
MCH: 32.2 pg (ref 27.0–33.0)
MCHC: 34.4 g/dL (ref 32.0–36.0)
MCV: 93.8 fL (ref 80.0–100.0)
MONOS PCT: 7.5 %
MPV: 10.5 fL (ref 7.5–12.5)
Neutro Abs: 2698 cells/uL (ref 1500–7800)
Neutrophils Relative %: 56.2 %
Platelets: 206 10*3/uL (ref 140–400)
RBC: 4.16 10*6/uL (ref 3.80–5.10)
RDW: 11.7 % (ref 11.0–15.0)
TOTAL LYMPHOCYTE: 34.2 %
WBC mixed population: 360 cells/uL (ref 200–950)
WBC: 4.8 10*3/uL (ref 3.8–10.8)

## 2018-05-08 LAB — COMPREHENSIVE METABOLIC PANEL
AG Ratio: 1.8 (calc) (ref 1.0–2.5)
ALBUMIN MSPROF: 4.4 g/dL (ref 3.6–5.1)
ALT: 13 U/L (ref 6–29)
AST: 22 U/L (ref 10–35)
Alkaline phosphatase (APISO): 36 U/L (ref 33–130)
BILIRUBIN TOTAL: 1 mg/dL (ref 0.2–1.2)
BUN: 12 mg/dL (ref 7–25)
CALCIUM: 9.7 mg/dL (ref 8.6–10.4)
CHLORIDE: 105 mmol/L (ref 98–110)
CO2: 29 mmol/L (ref 20–32)
Creat: 0.71 mg/dL (ref 0.50–1.05)
GLOBULIN: 2.4 g/dL (ref 1.9–3.7)
Glucose, Bld: 78 mg/dL (ref 65–99)
POTASSIUM: 3.8 mmol/L (ref 3.5–5.3)
Sodium: 143 mmol/L (ref 135–146)
Total Protein: 6.8 g/dL (ref 6.1–8.1)

## 2018-05-08 LAB — LIPID PANEL
Cholesterol: 145 mg/dL (ref <200)
HDL: 49 mg/dL — AB (ref >50)
LDL Cholesterol (Calc): 82 mg/dL (calc)
Non-HDL Cholesterol (Calc): 96 mg/dL (calc) (ref <130)
Total CHOL/HDL Ratio: 3 (calc) (ref <5.0)
Triglycerides: 56 mg/dL (ref <150)

## 2018-05-08 LAB — FOLLICLE STIMULATING HORMONE: FSH: 81.4 m[IU]/mL

## 2018-05-08 LAB — TSH: TSH: 1.77 m[IU]/L

## 2018-05-11 LAB — PAP IG W/ RFLX HPV ASCU

## 2018-05-18 ENCOUNTER — Telehealth: Payer: Self-pay | Admitting: Psychology

## 2018-05-18 NOTE — Telephone Encounter (Signed)
Yoko has contacted office 3 x's regarding medical records for a PT that treated her in past and also an FCE we ordered.  I had her sign ROI to obtain our copy of FCE - but advised by message and phone in person that we cannot give her medical records for the therapist.  She has FCE - and ROI is stored on her medical record.

## 2018-05-22 ENCOUNTER — Ambulatory Visit: Payer: BLUE CROSS/BLUE SHIELD | Admitting: Gynecology

## 2018-05-25 ENCOUNTER — Encounter: Payer: BLUE CROSS/BLUE SHIELD | Attending: Psychology | Admitting: Psychology

## 2018-05-25 DIAGNOSIS — G8114 Spastic hemiplegia affecting left nondominant side: Secondary | ICD-10-CM | POA: Diagnosis not present

## 2018-05-25 DIAGNOSIS — Z833 Family history of diabetes mellitus: Secondary | ICD-10-CM | POA: Insufficient documentation

## 2018-05-25 DIAGNOSIS — Z87820 Personal history of traumatic brain injury: Secondary | ICD-10-CM | POA: Insufficient documentation

## 2018-05-25 DIAGNOSIS — F068 Other specified mental disorders due to known physiological condition: Secondary | ICD-10-CM

## 2018-05-25 DIAGNOSIS — S069X0S Unspecified intracranial injury without loss of consciousness, sequela: Secondary | ICD-10-CM

## 2018-05-25 DIAGNOSIS — R4189 Other symptoms and signs involving cognitive functions and awareness: Secondary | ICD-10-CM

## 2018-05-29 ENCOUNTER — Encounter: Payer: BLUE CROSS/BLUE SHIELD | Admitting: Psychology

## 2018-05-29 DIAGNOSIS — F068 Other specified mental disorders due to known physiological condition: Secondary | ICD-10-CM

## 2018-05-29 DIAGNOSIS — S069X0S Unspecified intracranial injury without loss of consciousness, sequela: Principal | ICD-10-CM

## 2018-05-29 DIAGNOSIS — Z87820 Personal history of traumatic brain injury: Secondary | ICD-10-CM | POA: Diagnosis not present

## 2018-06-05 NOTE — Progress Notes (Signed)
BEHAVIORAL OBSERVATIONS: The patient was on time to her appointment. She completed two hours of neuropsychological testing (05/29/18) and persisted well. She presented with mostly bright but somewhat anxious mood with appropriate affect. She was administered the Wechsler Memory Scale, 4th edition (WMS-IV, Adult Battery) and completed all assigned tasks. She was alert and fully oriented. Attention was somewhat variable but she displayed good effort overall. She demonstrated good distress tolerance and appropriate behavior. She exhibited significant weakness and limited mobility on her left side and used a cane for assistance. No issued to report. Results are as follows:  Brief Cognitive Status Exam Classification  Age Years of Education Raw Score Classification Level Base Rate  52 years 10 months 16 58 Average 100.0     Index Score Summary  Index Sum of Scaled Scores Index Score Percentile Rank 95% Confidence Interval Qualitative Descriptor  Auditory Memory (AMI) 27 81 10 76-88 Low Average  Visual Memory (VMI) 48 112 79 106-117 High Average  Visual Working Memory (VWMI) 15 85 16 79-93 Low Average  Immediate Memory (IMI) 35 91 27 85-98 Average  Delayed Memory (DMI) 40 100 50 93-107 Average    Primary Subtest Scaled Score Summary  Subtest Domain Raw Score Scaled Score Percentile Rank  Logical Memory I AM 12 4 2   Logical Memory II AM 7 4 2   Verbal Paired Associates I AM 33 10 50  Verbal Paired Associates II AM 9 9 37  Designs I VM 63 9 37  Designs II VM 64 12 75  Visual Reproduction I VM 39 12 75  Visual Reproduction II VM 39 15 95  Spatial Addition VWM 10 8 25   Symbol Span VWM 16 7 16     Visual Memory Process Score Summary  Process Score Raw Score Scaled Score Percentile Rank Cumulative Percentage (Base Rate)  DE I Content 34 9 37 -  DE I Spatial 15 9 37 -  DE II Content 33 10 50 -  DE II Spatial 17 14 91 -  DE II Recognition 15 - - 51-75%  VR II Recognition 6 - - 51-75%    Delayed Memory Index  Subtest Scaled Score DMI Mean Score Difference from Mean Critical Value Base Rate  Logical Memory II 4 10.00 -6.00 2.44 <1%  Verbal Paired Associates II 9 10.00 -1.00 2.44 >25%  Designs II 12 10.00 2.00 2.44 >25%  Visual Reproduction II 15 10.00 5.00 1.57 2-5%  Statistical significance (critical value) at the .05 level.    INDEX-LEVEL CONTRAST SCALED SCORES  WMS-IV Indexes  Score Score 1 Score 2 Contrast Scaled Score  Auditory Memory Index vs. Visual Memory Index 81 112 14  Visual Working Memory Index vs. Visual Memory Index 85 112 15  Immediate Memory Index vs. Delayed Memory Index 91 100 13

## 2018-06-05 NOTE — Progress Notes (Signed)
The patient was on time to her 13:00 appointment. She completed two hours of neuropsychological testing (e.g. Repeatable Battery for the Assessment of Neuropsychological Status, Update, Form A) and persisted well. She presented with mostly bright but somewhat anxious mood with appropriate affect. Attention was somewhat reduced but she appeared to be giving good effort. Next testing session scheduled for 05/29/18 to assess learning and memory. No issues to report. Results are as follows:  RBANS- Update, Form A  Measure Standard Score/ Scaled Score Percentile Description  Immediate Memory 78 7 Borderline  List Learning 8 25 Average  Story Memory 4 2 Borderline  Visuosptail/Constructional 126 96 Superior  Figure Copy 13 84 High Average  Line Orientation - >75 High Average  Language 109 73 Average  Picture Naming - 51-75 Average  Semantic Fluency 13 84 High Average  Attention 88 21 Low Average  Digit Span 6 9 Low Average  Coding 10 50 Average  Delayed Memory 97 42 Average  List Recall - 17-25 Low Average-Average  List Recognition - 51-75 Average  Story Recall 6 9 Low Average  Figure Recall 12 75 Average

## 2018-06-16 ENCOUNTER — Encounter: Payer: Self-pay | Admitting: Psychology

## 2018-06-16 ENCOUNTER — Encounter (HOSPITAL_BASED_OUTPATIENT_CLINIC_OR_DEPARTMENT_OTHER): Payer: BLUE CROSS/BLUE SHIELD | Admitting: Psychology

## 2018-06-16 DIAGNOSIS — Z87820 Personal history of traumatic brain injury: Secondary | ICD-10-CM | POA: Diagnosis not present

## 2018-06-16 DIAGNOSIS — G8114 Spastic hemiplegia affecting left nondominant side: Secondary | ICD-10-CM | POA: Diagnosis not present

## 2018-06-16 DIAGNOSIS — S069X0S Unspecified intracranial injury without loss of consciousness, sequela: Secondary | ICD-10-CM

## 2018-06-16 DIAGNOSIS — F068 Other specified mental disorders due to known physiological condition: Secondary | ICD-10-CM | POA: Diagnosis not present
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
## 2018-06-16 NOTE — Progress Notes (Signed)
Neuropsychological Evaluation   Patient:  Martha Caldwell   DOB: [birth year]  MR Number: [id number]  Location: [facility] [address] [place], [id number] [facility] [place] [address] Dept: [phone]  Start: 8 AM End: 9 AM  Provider/Observer:     Hershal Coria PsyD  Chief Complaint:      Chief Complaint  Patient presents with  . Memory Loss  . Other    Information processing speed issues as well as vigilance issues.    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  However, the patient has been noticing more memory changes recently.  The patient describes the memory deficits as having some variability but appear to be worsening and the primary issues have to do with short-term memory.  The patient reports that she also experiences slowed information processing speed and difficulty with attention/vigilance.  The patient is noticed difficulties with speech articulation as well.  The patient reports that she worked for many years for [employer] is an [employer] but retired and did not return to work after her accident.  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  Testing Administered:  RBANS, WMS-IV  Participation Level:   Active  Participation Quality:  Appropriate      Behavioral Observation:  Well Groomed, Alert, and Appropriate.   Test Results:   Initially, the patient was administered the RBANS (repeatable battery for the assessment of neuropsychological status form A) below are the behavioral observations provided by Dr. Vella Kohler, who administered the specific test.  The patient was on time to her 13:00 appointment. She  completed two hours of neuropsychological testing (e.g. Repeatable Battery for the Assessment of Neuropsychological Status, Update, Form A) and persisted well. She presented with mostly bright but somewhat anxious mood with appropriate affect. Attention was somewhat reduced but she appeared to be giving good effort. Next testing session scheduled for 05/29/18 to assess learning and memory. No issues to report.      RBANS Update Form A Total Scale 99  The patient produced a total score index score of 99 which falls in the average range.  This measures a composite of all indices within the battery and is often seen as a good indicator of general cognitive functioning.  There was considerable variability in the patient's various index measures with the patient ranging in performance from the very high range to the borderline range of functioning on different indices.      RBANS Update Form A Immediate Memory 78  The patient produced an immediate memory index score of 78 which falls in the borderline range of functioning.  This is her lowest functioning index score.  Items making up this index show that her greatest difficulty had to do with story recall but she did better relatively speaking on list learning.  This measure assesses initial encoding and learning of complex and simple verbal information.        RBANS Update Form A Visuospatial/ Constructional 126  The patient produced an excellent performance and one that falls in the superior range of functioning with regard to visual spatial/constructional abilities.  This index measures both visual spatial perception and ability to copy a  design from a model.  There are no indications of visual neglect or visual inattention to detail.        RBANS Update Form A Attention 88  The patient produced an index score of 88 on the attention index which falls in the low average range of functioning.  There was considerable variability on items making up this  attention index.  For example the patient had significant impairments with regard to auditory encoding but did much better on items related to overall information processing speed and visual scanning/searching.         RBANS Update Form A Language 109  The patient produced a language index score of 109 which falls at the upper end of the average range.  The patient did quite well on picture naming or cued naming items as well as did quite well on somatic fluency and verbal fluency that was self generated.  Overall, there does not appear to be any deficits with regard to expressive language abilities.     RBANS Update Form A Delayed Memory 97  The patient produced a delayed memory index score of 97 which falls in the average range.  However, there was considerable variability within the subtest measures.  For example, the patient had difficulty with story recall in which context and relationship to information was applied.  The patient did very well with regard to delayed visual memory and figure recall.  With regard to auditory recall the patient did much better with acute recall versus free recall of previously learned list of information.  This pattern suggests that the memory weaknesses identified have primarily to do with retrieval of information rather than an inability to actually store and organize information.  To further assess memory and attention the patient was administered the Wechsler Memory Scale-IV.  Below are the behavioral observations generated by Dr. Zusman during this evaluVella Kohleration.  BEHAVIORAL OBSERVATIONS: The patient was on time to her appointment. She completed two hours of neuropsychological testing (05/29/18) and persisted well. She presented with mostly bright but somewhat anxious mood with appropriate affect. She was administered the Wechsler Memory Scale, 4th edition (WMS-IV, Adult Battery) and completed all assigned tasks. She was alert and fully oriented. Attention was  somewhat variable but she displayed good effort overall. She demonstrated good distress tolerance and appropriate behavior. She exhibited significant weakness and limited mobility on her left side and used a cane for assistance. No issued to report.    Index Score Summary  Index Sum of Scaled Scores Index Score Percentile Rank 95% Confidence Interval Qualitative Descriptor  Auditory Memory (AMI) 27 81 10 76-88 Low Average  Visual Memory (VMI) 48 112 79 106-117 High Average  Visual Working Memory (VWMI) 15 85 16 79-93 Low Average  Immediate Memory (IMI) 35 91 27 85-98 Average  Delayed Memory (DMI) 40 100 50 93-107 Average   Analyzing the patient's educational history and occupational history it is predicted that the patient should be producing performances somewhere around index scores of 115 on these memory measures.  We will use the 115 index score as a predictor of premorbid functioning.  Primary Subtest Scaled Score Summary  Subtest Domain Raw Score Scaled Score Percentile Rank  Logical Memory I AM 12 4 2   Logical Memory II AM 7 4 2   Verbal Paired Associates I AM 33 10 50  Verbal Paired Associates II AM 9 9 37  Designs I VM 63 9 37  Designs II VM 64 12 75  Visual Reproduction I  VM 39 12 75  Visual Reproduction II VM 39 15 95  Spatial Addition VWM 10 8 25   Symbol Span VWM 16 7 16     Overall, the patient produced performances that were quite variable in overall performance.  The patient produced scores ranging from the high average (112) to the low average range of functioning (81).  The patient's performance produced an index score of 85 (low average range) for visual working memory.  This visual working memory score is very consistent with the auditory working memory score derived from the RBANS.  The patient produced a composite index score for immediate memory that assesses both visual and auditory immediate memory in the average range with an index score of 91 and falling in the  27th percentile.  Breaking down this immediate memory and to auditory and visual modalities the patient produced an auditory memory index score of 81 which falls in the low average range and in the 10th percentile.  This performance is in sharp contrast to her visual memory score of 112, which falls in the high average range and at the 79th percentile.  This visual versus auditory produces a contrast scale score of 14 which suggest significant differences between auditory versus visual memory and learning.  The patient produced a delayed memory index score of 100 which falls at the 50th percentile and is in the average range.  The significant contrast between visual and auditory learning is clearly noted here as well.  For example, on the logical memory 2 subtest which is a delayed recall task of story learning the patient produced a scaled score of 4 which falls at the 2nd percentile rank.  On the visual reproduction to measure which is a delayed recall of visual learning information the patient produced a scaled score of 15 which is at the 95th percentile.  Specific subtests of note there is a clear strength versus weakness in regard to memory and learning.  The patient's overall encoding of visual versus auditory information appears to be generally consistent and in the low average range of overall functioning.  However, immediate and delayed memory and recall for auditory information is significantly impaired while visual recall and storing of information shows clear and specific strengths.  There does not appear to be any indication of loss of information on delayed recall visual tasks.  Summary of Results:   Overall, the results of the current neuropsychological assessment do suggest that the patient is functioning in the overall average range as far as neuropsychological functioning.  Given the patient's prior education and occupational history this does appear to be a mild to moderate decrease in overall  cognitive functioning.  However, there is clear and marked variability with regard to various neuropsychological strengths and weaknesses.  For example, showed specific and retained strengths with regard to visual spatial/visual constructional abilities.  While this is likely a premorbid strength for the patient given her education and occupational as well as ongoing interest in art this does appear to be a well-preserved area of cognitive functioning.  The patient also retains good expressive language although there are some mild relative weaknesses with regard to free recall of specific words under time constraints.  The patient shows mild to moderate weaknesses with regard to both auditory and visual encoding abilities.  However, there are consistent marked contrast between immediate auditory versus visual memory.  The patient produced visual memory index scores of 112 and falling in the 79th percentile for visual memory while she produced  auditory memory index scores of 81.  This auditory memory falls in the 10th percentile.  The patient's performance was excellent with regard to measures of immediate and delayed recall of visual information falling between the 75th and 95th percentile range of functioning.  Immediate and delayed auditory memory weaknesses also showed considerable variability.  For example, on the story learning memory trials of both the RBANS and the WMS the patient produced a scaled scores of 4 and performances that file in the 2nd percentile of overall functioning.  On list learning and to a greater extent paired verbal association learning her performance was much better and in the low average to average range of functioning.  This pattern strongly suggest that the primary weakness for auditory learning has to do with retrieval of information and whenever there is a specific cueing of information the patient's performance improved significantly.  Impression/Diagnosis:   Overall, the  results of the current neuropsychological evaluation are consistent with the specific hemispheric location of the patient's traumatic brain injury that she experienced in 1997.  The patient has persisted with left-sided motor deficits which would suggest right frontal TBI.  The patient also shows significant weaknesses with regard to auditory memory deficits versus visual memory deficits.  The patient shows excellent visual memory and recall both immediate and delayed.  There are patterns of weaknesses with regard to encoding of both auditory and visual information but her visual recall and delayed visual recall is excellent.  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  The indications of diffuse axonal and white matter injuries would explain difficulties with retrieval of verbal information but much better storage verbal information and therefore would strongly suggest that cueing will be quite beneficial for her verbal memory deficits.    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 Neuropsychologist

## 2018-07-02 ENCOUNTER — Encounter: Payer: BLUE CROSS/BLUE SHIELD | Attending: Psychology | Admitting: Psychology

## 2018-07-02 DIAGNOSIS — F068 Other specified mental disorders due to known physiological condition: Secondary | ICD-10-CM | POA: Diagnosis not present

## 2018-07-02 DIAGNOSIS — G8114 Spastic hemiplegia affecting left nondominant side: Secondary | ICD-10-CM | POA: Diagnosis not present

## 2018-07-02 DIAGNOSIS — Z833 Family history of diabetes mellitus: Secondary | ICD-10-CM | POA: Diagnosis not present

## 2018-07-02 DIAGNOSIS — Z87820 Personal history of traumatic brain injury: Secondary | ICD-10-CM | POA: Diagnosis present

## 2018-07-02 DIAGNOSIS — S069X0S Unspecified intracranial injury without loss of consciousness, sequela: Secondary | ICD-10-CM | POA: Diagnosis not present

## 2018-07-02 NOTE — Progress Notes (Signed)
Today I provided feedback regarding the results of the recent neuropsychological evaluation.  It does not appear that the patient is showing any objective symptoms of worsening of her cognitive deficits but the patient does continue to be disabled and have significant residual cognitive deficits.  Below is the summary and interpretations from the evaluation that can be found in full on the note dated 06/16/2018.    Summary of Results:                        Overall, the results of the current neuropsychological assessment do suggest that the patient is functioning in the overall average range as far as neuropsychological functioning.  Given the patient's prior education and occupational history this does appear to be a mild to moderate decrease in overall cognitive functioning.  However, there is clear and marked variability with regard to various neuropsychological strengths and weaknesses.  For example, showed specific and retained strengths with regard to visual spatial/visual constructional abilities.  While this is likely a premorbid strength for the patient given her education and occupational as well as ongoing interest in art this does appear to be a well-preserved area of cognitive functioning.  The patient also retains good expressive language although there are some mild relative weaknesses with regard to free recall of specific words under time constraints.  The patient shows mild to moderate weaknesses with regard to both auditory and visual encoding abilities.  However, there are consistent marked contrast between immediate auditory versus visual memory.  The patient produced visual memory index scores of 112 and falling in the 79th percentile for visual memory while she produced auditory memory index scores of 81.  This auditory memory falls in the 10th percentile.  The patient's performance was excellent with regard to measures of immediate and delayed recall of visual information falling between  the 75th and 95th percentile range of functioning.  Immediate and delayed auditory memory weaknesses also showed considerable variability.  For example, on the story learning memory trials of both the RBANS and the WMS the patient produced a scaled scores of 4 and performances that file in the 2nd percentile of overall functioning.  On list learning and to a greater extent paired verbal association learning her performance was much better and in the low average to average range of functioning.  This pattern strongly suggest that the primary weakness for auditory learning has to do with retrieval of information and whenever there is a specific cueing of information the patient's performance improved significantly.  Impression/Diagnosis:                     Overall, the results of the current neuropsychological evaluation are consistent with the specific hemispheric location of the patient's traumatic brain injury that she experienced in 1997.  The patient has persisted with left-sided motor deficits which would suggest right frontal TBI.  The patient also shows significant weaknesses with regard to auditory memory deficits versus visual memory deficits.  The patient shows excellent visual memory and recall both immediate and delayed.  There are patterns of weaknesses with regard to encoding of both auditory and visual information but her visual recall and delayed visual recall is excellent.  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  The indications of diffuse axonal and white  matter injuries would explain difficulties with retrieval of verbal information but much better storage verbal information and therefore would strongly suggest that cueing will be quite beneficial for her verbal memory deficits.    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 Neuropsychologist

## 2018-07-03 ENCOUNTER — Ambulatory Visit: Payer: BLUE CROSS/BLUE SHIELD | Admitting: Psychology

## 2018-07-06 ENCOUNTER — Encounter: Payer: Self-pay | Admitting: Psychology

## 2018-07-10 ENCOUNTER — Ambulatory Visit: Payer: BLUE CROSS/BLUE SHIELD | Admitting: Psychology

## 2018-07-13 ENCOUNTER — Ambulatory Visit: Payer: BLUE CROSS/BLUE SHIELD | Admitting: Psychology

## 2018-07-14 ENCOUNTER — Ambulatory Visit: Payer: BLUE CROSS/BLUE SHIELD | Admitting: Psychology

## 2018-07-20 ENCOUNTER — Encounter: Payer: Self-pay | Admitting: Psychology

## 2018-07-22 ENCOUNTER — Ambulatory Visit: Payer: BLUE CROSS/BLUE SHIELD | Admitting: Gynecology

## 2018-09-09 ENCOUNTER — Ambulatory Visit: Payer: BLUE CROSS/BLUE SHIELD | Admitting: Gynecology

## 2018-10-12 ENCOUNTER — Other Ambulatory Visit: Payer: Self-pay

## 2018-10-14 ENCOUNTER — Ambulatory Visit: Payer: BLUE CROSS/BLUE SHIELD | Admitting: Gynecology

## 2018-10-14 ENCOUNTER — Other Ambulatory Visit: Payer: Self-pay

## 2018-10-14 ENCOUNTER — Encounter: Payer: Self-pay | Admitting: Gynecology

## 2018-10-14 VITALS — BP 114/70

## 2018-10-14 DIAGNOSIS — Z30432 Encounter for removal of intrauterine contraceptive device: Secondary | ICD-10-CM

## 2018-10-14 NOTE — Progress Notes (Signed)
    Martha Caldwell [birth year] [id number]        53 y.o.  G2P2 presents to have her IUD removed.  She is at the 5-year mark.  Had elevated FSH last year.  Husband had vasectomy.  Past medical history,surgical history, problem list, medications, allergies, family history and social history were all reviewed and documented in the EPIC chart.  Directed ROS with pertinent positives and negatives documented in the history of present illness/assessment and plan.  Exam: Kennon Portela assistant Vitals:   10/14/18 1103  BP: 114/70   General appearance:  Normal Abdomen soft nontender without masses guarding rebound Pelvic external BUS vagina normal.  Cervix normal.  IUD string visualized.  Uterus normal size midline mobile nontender.  Adnexa without masses or tenderness.  Procedure: The IUD string was grasped with a [place] forcep and her Mirena IUD was removed without difficulty, shown to the patient and discarded.  Assessment/Plan:  53 y.o. G2P2 with successful removal of her Mirena IUD.  She will keep a symptom and bleeding history.  If any bleeding she will call.  If significant menopausal symptoms develop she will call.  Otherwise she will follow-up this coming fall when she is due for her annual exam.    Dara Lords MD, 11:36 AM 10/14/2018

## 2018-10-14 NOTE — Patient Instructions (Signed)
Follow-up this coming fall for annual exam when you are due.

## 2019-03-10 ENCOUNTER — Encounter: Payer: Self-pay | Admitting: Gynecology

## 2019-05-10 ENCOUNTER — Encounter: Payer: BLUE CROSS/BLUE SHIELD | Admitting: Gynecology

## 2019-08-06 ENCOUNTER — Telehealth: Payer: Self-pay | Admitting: *Deleted

## 2019-08-06 NOTE — Telephone Encounter (Signed)
Patient left  message stating her handicap placard has expired and needs a new one.  She also states that she has a job at [employer].  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  She is asking if Dr. Riley Kill would write a letter stating that she is unable to perform this duty due to her disability status.

## 2019-08-06 NOTE — Telephone Encounter (Signed)
These are things typically done by a doctor who is still seeing a pt. I haven't seen her for a year, nor does she have any future appts scheduled.

## 2019-08-06 NOTE — Telephone Encounter (Signed)
Patient notified that she needs to make an appointment

## 2023-04-13 LAB — EXTERNAL GENERIC LAB PROCEDURE: COLOGUARD: NEGATIVE

## 2023-04-13 LAB — COLOGUARD: COLOGUARD: NEGATIVE

## 2023-09-11 ENCOUNTER — Ambulatory Visit: Admitting: Podiatry

## 2023-09-11 ENCOUNTER — Ambulatory Visit (INDEPENDENT_AMBULATORY_CARE_PROVIDER_SITE_OTHER)

## 2023-09-11 DIAGNOSIS — Z87820 Personal history of traumatic brain injury: Secondary | ICD-10-CM

## 2023-09-11 DIAGNOSIS — M2042 Other hammer toe(s) (acquired), left foot: Secondary | ICD-10-CM

## 2023-09-11 DIAGNOSIS — G8114 Spastic hemiplegia affecting left nondominant side: Secondary | ICD-10-CM
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
## 2023-09-17 ENCOUNTER — Ambulatory Visit: Admitting: Podiatry

## 2023-10-08 ENCOUNTER — Encounter: Payer: Self-pay | Admitting: Podiatry

## 2023-10-08 ENCOUNTER — Ambulatory Visit (INDEPENDENT_AMBULATORY_CARE_PROVIDER_SITE_OTHER): Admitting: Podiatry

## 2023-10-08 DIAGNOSIS — M2042 Other hammer toe(s) (acquired), left foot: Secondary | ICD-10-CM

## 2023-10-13 ENCOUNTER — Encounter: Payer: Self-pay | Admitting: Podiatry

## 2023-10-13 ENCOUNTER — Telehealth: Payer: Self-pay | Admitting: Podiatry

## 2023-10-13 NOTE — Telephone Encounter (Signed)
 Pt called back and was asking about insurance coverage. I sent email to billing and gave her number and code to the surgery center.

## 2023-10-13 NOTE — Telephone Encounter (Signed)
 Pt left message and sent email stating she is needing to get surgery scheduled with Dr Luster Salters , possibly 5/22 as her husband travels out of th country and he will be here to help.  I returned call and left message for pt to call me to get scheduled.

## 2023-10-13 NOTE — Telephone Encounter (Signed)
 Pt scheduled for surgery on 5/22.   She does have some questions she did not ask and is going to send a message thru my chart with the questions.   Please be on the look out for them.

## 2023-10-21 ENCOUNTER — Telehealth: Payer: Self-pay | Admitting: Podiatry

## 2023-10-21 NOTE — Telephone Encounter (Deleted)
 PT insurance was not approved due to [employer] not being approved. Please advise on next steps

## 2023-10-27 ENCOUNTER — Telehealth: Payer: Self-pay | Admitting: Podiatry

## 2023-10-27 NOTE — Telephone Encounter (Signed)
 I called PT and informed her that she is OON for the [facility] and that she will have to call her insurance company and find a provider in her network for her surgery or pay OOP. This surgery will be canceled.

## 2023-10-28 ENCOUNTER — Telehealth: Payer: Self-pay | Admitting: Podiatry

## 2023-10-28 NOTE — Telephone Encounter (Signed)
 Called pt and canceled her surgery her insurance is out of network with us , the surgery center and [facility]. She has the [facility] and it is in network with [facility]. Pt is aware and I cxled thru the hospital as well. She is to call once she figures out another provider for us  to possibly forward her records.

## 2023-10-29 ENCOUNTER — Telehealth: Payer: Self-pay | Admitting: Podiatry

## 2023-10-29 NOTE — Telephone Encounter (Signed)
 Pt called and left 2 messages yesterday after 4pm stating she is going to see Dr Terance Felt off of [address] and would like records sent there. She has an appt tomorrow 5/14 at 1100am.  I returned call and explained to pt that when she is in the office to ask them for a release form for them to request records from our office. I did ask if she verified with her insurance they are in network and she said she did. She is to have that office send over a request for her records.
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
## 2023-11-12 ENCOUNTER — Encounter: Admitting: Podiatry

## 2023-11-19 ENCOUNTER — Ambulatory Visit (INDEPENDENT_AMBULATORY_CARE_PROVIDER_SITE_OTHER): Admitting: "Endocrinology

## 2023-11-19 ENCOUNTER — Encounter: Admitting: Podiatry

## 2023-11-19 ENCOUNTER — Encounter: Payer: Self-pay | Admitting: "Endocrinology

## 2023-11-19 VITALS — BP 120/70 | HR 96 | Ht 65.5 in | Wt 116.0 lb

## 2023-11-19 DIAGNOSIS — M810 Age-related osteoporosis without current pathological fracture: Secondary | ICD-10-CM | POA: Diagnosis not present
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
## 2023-11-20 ENCOUNTER — Other Ambulatory Visit (HOSPITAL_BASED_OUTPATIENT_CLINIC_OR_DEPARTMENT_OTHER): Admitting: Radiology

## 2023-11-21 ENCOUNTER — Ambulatory Visit: Admitting: "Endocrinology

## 2023-11-25 ENCOUNTER — Ambulatory Visit (INDEPENDENT_AMBULATORY_CARE_PROVIDER_SITE_OTHER)
Admission: RE | Admit: 2023-11-25 | Discharge: 2023-11-25 | Disposition: A | Discharge status: Home / Self Care | Source: Ambulatory Visit | Attending: "Endocrinology | Admitting: "Endocrinology

## 2023-11-25 ENCOUNTER — Encounter: Payer: Self-pay | Admitting: "Endocrinology

## 2023-11-25 DIAGNOSIS — M810 Age-related osteoporosis without current pathological fracture: Secondary | ICD-10-CM | POA: Diagnosis not present

## 2023-11-25 DIAGNOSIS — Z13820 Encounter for screening for osteoporosis: Secondary | ICD-10-CM | POA: Diagnosis not present

## 2023-12-01 ENCOUNTER — Ambulatory Visit: Payer: Self-pay | Admitting: "Endocrinology

## 2023-12-01 ENCOUNTER — Other Ambulatory Visit: Payer: Self-pay | Admitting: "Endocrinology

## 2023-12-01 NOTE — Progress Notes (Signed)
 We will schedule Prolia based on patient's bone density results.  Calcium and vitamin D  within normal limits.

## 2023-12-03 ENCOUNTER — Encounter: Admitting: Podiatry

## 2023-12-09 ENCOUNTER — Telehealth: Payer: Self-pay

## 2023-12-09 NOTE — Telephone Encounter (Signed)
 Update insurance  Dear Dr. DONELL BUTTS,  In follow up to your request, [employer] has researched insurance coverage for therapy with  Prolia (denosumab) for your patient, Andra Heslin with DOB December 10, 1965.  Your patient's health plan has advised that your patient's policy terminated on 2023-12-08.  Please contact [employer] to provide updated insurance information if available, and to  request an insurance verification for the patient's new insurance.  If you have any questions about this letter, please call [employer] at [phone],  Monday through Friday, 9:00 AM to 8:00 PM Eastern Time.  Sincerely,    [employer]

## 2023-12-09 NOTE — Telephone Encounter (Signed)
Prolia VOB initiated via [email or web address]  Next Prolia inj DUE: new start

## 2023-12-25 NOTE — Telephone Encounter (Signed)
 Updated insurance.

## 2023-12-25 NOTE — Telephone Encounter (Signed)
Insurance updated, VOB resubmitted

## 2024-01-05 NOTE — Telephone Encounter (Signed)
 Update insurance  [employer] [place] [employer]  Per [employer]:   Re: Haematologist, [employer] ID   Dear Dr. DONELL BUTTS,  In follow up to your request, [employer] has researched insurance coverage for therapy with  Prolia (denosumab) for your patient, Brylee Berk with DOB [birth year].  Your patient's health plan has advised that your patient's policy terminated on 2023-12-08.  Please contact [employer] to provide updated insurance information if available, and to  request an insurance verification for the patient's new insurance.  If you have any questions about this letter, please call [employer] at [phone],  Monday through Friday, 9:00 AM to 8:00 PM Eastern Time.

## 2024-01-08 NOTE — Telephone Encounter (Signed)
 Insurance updated, VOB re-submitted.  We may need to fill through pharmacy benefits as this is a Medicaid plan.

## 2024-01-13 NOTE — Telephone Encounter (Signed)
 Medical Buy and Bill  Prior Authorization NOT required for PROLIA  COVERAGE DETAILS: For the primary MD Purchase option, Prolia and administration will be covered at  100%. No deductible, coinsurance or out of pocket max applies. Referral is not required. We have provided  in network benefits only

## 2024-01-13 NOTE — Telephone Encounter (Signed)
 Medical Buy and Bill  Patient ready for scheduling on or after 01/13/24  Out-of-pocket cost due at time of visit: $  Primary: [facility] Medicaid [place] Prolia co-insurance: 0% Admin fee co-insurance: 0%  Deductible: does not apply  Prior Auth: NOT required PA# Valid:   Secondary: N/A Prolia co-insurance:  Admin fee co-insurance:  Deductible:  Prior Auth:  PA# Valid:     ** This summary of benefits is an estimation of the patient's out-of-pocket cost. Exact cost may very based on individual plan coverage.

## 2024-01-30 ENCOUNTER — Ambulatory Visit (INDEPENDENT_AMBULATORY_CARE_PROVIDER_SITE_OTHER)

## 2024-01-30 VITALS — BP 122/80 | HR 105 | Resp 16 | Wt 118.0 lb

## 2024-01-30 DIAGNOSIS — M810 Age-related osteoporosis without current pathological fracture: Secondary | ICD-10-CM | POA: Diagnosis not present

## 2024-01-30 MED ORDER — DENOSUMAB 60 MG/ML ~~LOC~~ SOSY
60.0000 mg | PREFILLED_SYRINGE | Freq: Once | SUBCUTANEOUS | Status: AC
  Administered 2024-01-30: 60 mg via SUBCUTANEOUS

## 2024-01-30 MED ORDER — DENOSUMAB 60 MG/ML ~~LOC~~ SOSY: 60.0000 mg | PREFILLED_SYRINGE | Freq: Once | SUBCUTANEOUS | Status: AC

## 2024-01-30 NOTE — Progress Notes (Signed)
 After obtaining consent, and per orders of Dr. Dartha, injection of Prolia  given by Dietrich LOISE Lax. Patient instructed to remain in clinic for 20 minutes afterwards, and to report any adverse reaction to me immediately.

## 2024-01-31 NOTE — Telephone Encounter (Signed)
 Last Prolia  inj 01/30/24 Next Prolia  inj due 08/02/24

## 2024-03-09 ENCOUNTER — Encounter: Payer: Self-pay | Admitting: "Endocrinology

## 2024-03-09 ENCOUNTER — Ambulatory Visit: Admitting: "Endocrinology

## 2024-03-09 VITALS — BP 104/80 | HR 84 | Ht 65.5 in | Wt 120.0 lb

## 2024-03-09 DIAGNOSIS — Z9189 Other specified personal risk factors, not elsewhere classified: Secondary | ICD-10-CM | POA: Diagnosis not present

## 2024-03-09 DIAGNOSIS — M810 Age-related osteoporosis without current pathological fracture: Secondary | ICD-10-CM | POA: Diagnosis not present
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
## 2024-03-09 NOTE — Patient Instructions (Signed)
 Recommend 100 international units Vit D and 600mg  calcium a day

## 2024-04-21 LAB — COMPREHENSIVE METABOLIC PANEL WITH GFR
AG Ratio: 1.9 (calc) (ref 1.0–2.5)
ALT: 16 U/L (ref 6–29)
AST: 26 U/L (ref 10–35)
Albumin: 4.4 g/dL (ref 3.6–5.1)
Alkaline phosphatase (APISO): 24 U/L — ABNORMAL LOW (ref 37–153)
BUN: 12 mg/dL (ref 7–25)
CO2: 33 mmol/L — ABNORMAL HIGH (ref 20–32)
Calcium: 10.1 mg/dL (ref 8.6–10.4)
Chloride: 103 mmol/L (ref 98–110)
Creat: 0.69 mg/dL (ref 0.50–1.03)
Globulin: 2.3 g/dL (ref 1.9–3.7)
Glucose, Bld: 80 mg/dL (ref 65–139)
Potassium: 4.7 mmol/L (ref 3.5–5.3)
Sodium: 143 mmol/L (ref 135–146)
Total Bilirubin: 0.9 mg/dL (ref 0.2–1.2)
Total Protein: 6.7 g/dL (ref 6.1–8.1)
eGFR: 101 mL/min/1.73m2 (ref >=60)

## 2024-04-21 LAB — VITAMIN D 25 HYDROXY (VIT D DEFICIENCY, FRACTURES): Vit D, 25-Hydroxy: 83 ng/mL (ref 30–100)

## 2024-07-13 NOTE — Telephone Encounter (Signed)
 Prolia  VOB initiated via [email or web address]  Next Prolia  inj DUE: 08/02/24

## 2024-07-19 NOTE — Telephone Encounter (Signed)
 [facility] [employer]  Patient is ready for scheduling on or after 08/02/24  Out-of-pocket cost due at time of visit: $4  Primary: [place]  Medicare Prolia  co-insurance: $4 Admin fee co-insurance: 0%  Deductible: $0 of $283 met  Prior Auth: NOT required  Secondary: N/A Prolia  co-insurance:  Admin fee co-insurance:  Deductible:  Prior Auth:  PA# Valid:   ** This summary of benefits is an estimation of the patient's out-of-pocket cost. Exact cost may vary based on individual plan coverage.

## 2024-08-09 ENCOUNTER — Ambulatory Visit: Admitting: "Endocrinology
